# Patient Record
Sex: Male | Born: 1970 | Race: White | Hispanic: No | Marital: Married | Smoking: Never smoker
Health system: Southern US, Community
[De-identification: ages and names within clinical notes are randomized; demographics above are authoritative.]

## PROBLEM LIST (undated history)

## (undated) DIAGNOSIS — N200 Calculus of kidney: Secondary | ICD-10-CM

## (undated) DIAGNOSIS — E785 Hyperlipidemia, unspecified: Secondary | ICD-10-CM

## (undated) DIAGNOSIS — F319 Bipolar disorder, unspecified: Secondary | ICD-10-CM

## (undated) DIAGNOSIS — F32A Depression, unspecified: Secondary | ICD-10-CM

## (undated) DIAGNOSIS — F419 Anxiety disorder, unspecified: Secondary | ICD-10-CM

## (undated) HISTORY — DX: Anxiety disorder, unspecified: F41.9

## (undated) HISTORY — DX: Depression, unspecified: F32.A

## (undated) HISTORY — PX: APPENDECTOMY: SHX54

## (undated) HISTORY — DX: Hyperlipidemia, unspecified: E78.5

## (undated) HISTORY — PX: BACK SURGERY: SHX140

## (undated) HISTORY — PX: TYMPANOPLASTY: SHX33

## (undated) HISTORY — PX: HERNIA REPAIR: SHX51

---

## 2004-07-08 ENCOUNTER — Emergency Department: Payer: Self-pay | Admitting: Unknown Physician Specialty

## 2004-09-10 ENCOUNTER — Observation Stay: Payer: Self-pay | Admitting: General Surgery

## 2004-09-23 ENCOUNTER — Emergency Department: Payer: Self-pay | Admitting: Emergency Medicine

## 2005-06-15 ENCOUNTER — Emergency Department: Payer: Self-pay | Admitting: Emergency Medicine

## 2005-06-23 ENCOUNTER — Emergency Department: Payer: Self-pay | Admitting: Emergency Medicine

## 2006-01-12 ENCOUNTER — Emergency Department: Payer: Self-pay | Admitting: Emergency Medicine

## 2006-01-14 ENCOUNTER — Ambulatory Visit: Payer: Self-pay | Admitting: Specialist

## 2008-09-21 ENCOUNTER — Emergency Department: Payer: Self-pay | Admitting: Emergency Medicine

## 2009-01-25 ENCOUNTER — Ambulatory Visit: Payer: Self-pay | Admitting: Unknown Physician Specialty

## 2009-02-12 ENCOUNTER — Ambulatory Visit: Payer: Self-pay | Admitting: Unknown Physician Specialty

## 2009-04-30 ENCOUNTER — Ambulatory Visit: Payer: Self-pay | Admitting: Unknown Physician Specialty

## 2009-09-23 ENCOUNTER — Emergency Department: Payer: Self-pay | Admitting: Emergency Medicine

## 2009-10-27 ENCOUNTER — Emergency Department: Payer: Self-pay | Admitting: Emergency Medicine

## 2009-12-19 ENCOUNTER — Ambulatory Visit: Payer: Self-pay | Admitting: Unknown Physician Specialty

## 2010-03-24 ENCOUNTER — Emergency Department: Payer: Self-pay | Admitting: Emergency Medicine

## 2010-03-27 ENCOUNTER — Emergency Department: Payer: Self-pay | Admitting: Unknown Physician Specialty

## 2010-07-14 ENCOUNTER — Emergency Department: Payer: Self-pay | Admitting: Internal Medicine

## 2011-08-15 ENCOUNTER — Emergency Department: Payer: Self-pay | Admitting: Emergency Medicine

## 2011-08-15 LAB — CBC
MCHC: 33.8 g/dL (ref 32.0–36.0)
RBC: 4.86 10*6/uL (ref 4.40–5.90)
WBC: 8.2 10*3/uL (ref 3.8–10.6)

## 2011-08-15 LAB — COMPREHENSIVE METABOLIC PANEL
BUN: 8 mg/dL (ref 7–18)
Bilirubin,Total: 0.2 mg/dL (ref 0.2–1.0)
Calcium, Total: 8.2 mg/dL — ABNORMAL LOW (ref 8.5–10.1)
Chloride: 106 mmol/L (ref 98–107)
Co2: 24 mmol/L (ref 21–32)
EGFR (Non-African Amer.): >60
Glucose: 276 mg/dL — ABNORMAL HIGH (ref 65–99)
Osmolality: 288 (ref 275–301)
Potassium: 3.7 mmol/L (ref 3.5–5.1)
SGPT (ALT): 57 U/L
Total Protein: 6.6 g/dL (ref 6.4–8.2)

## 2011-08-15 LAB — URINALYSIS, COMPLETE
Bacteria: NONE SEEN
Bilirubin,UR: NEGATIVE
Ketone: NEGATIVE
Ph: 5 (ref 4.5–8.0)
Specific Gravity: 1.033 (ref 1.003–1.030)
WBC UR: <1 /HPF (ref 0–5)

## 2014-02-27 ENCOUNTER — Emergency Department: Payer: Self-pay | Admitting: Emergency Medicine

## 2014-02-27 LAB — URINALYSIS, COMPLETE
BACTERIA: NONE SEEN
Bilirubin,UR: NEGATIVE
GLUCOSE, UR: NEGATIVE mg/dL (ref 0–75)
Nitrite: NEGATIVE
Ph: 5 (ref 4.5–8.0)
Protein: 30
RBC,UR: 4 /HPF (ref 0–5)
SPECIFIC GRAVITY: 1.024 (ref 1.003–1.030)
Squamous Epithelial: NONE SEEN
WBC UR: 9 /HPF (ref 0–5)

## 2014-02-27 LAB — COMPREHENSIVE METABOLIC PANEL
ANION GAP: 9 (ref 7–16)
Albumin: 4.1 g/dL (ref 3.4–5.0)
Alkaline Phosphatase: 63 U/L
BUN: 16 mg/dL (ref 7–18)
Bilirubin,Total: 0.8 mg/dL (ref 0.2–1.0)
CREATININE: 1.18 mg/dL (ref 0.60–1.30)
Calcium, Total: 9.3 mg/dL (ref 8.5–10.1)
Chloride: 104 mmol/L (ref 98–107)
Co2: 24 mmol/L (ref 21–32)
EGFR (African American): >60
Glucose: 110 mg/dL — ABNORMAL HIGH (ref 65–99)
Osmolality: 276 (ref 275–301)
Potassium: 3.8 mmol/L (ref 3.5–5.1)
SGOT(AST): 17 U/L (ref 15–37)
SGPT (ALT): 28 U/L
Sodium: 137 mmol/L (ref 136–145)
Total Protein: 7.5 g/dL (ref 6.4–8.2)

## 2014-02-27 LAB — CBC
HCT: 49.7 % (ref 40.0–52.0)
HGB: 16.5 g/dL (ref 13.0–18.0)
MCH: 29.6 pg (ref 26.0–34.0)
MCHC: 33.3 g/dL (ref 32.0–36.0)
MCV: 89 fL (ref 80–100)
PLATELETS: 218 10*3/uL (ref 150–440)
RBC: 5.58 10*6/uL (ref 4.40–5.90)
RDW: 13.3 % (ref 11.5–14.5)
WBC: 12.1 10*3/uL — ABNORMAL HIGH (ref 3.8–10.6)

## 2014-02-28 DIAGNOSIS — K5900 Constipation, unspecified: Secondary | ICD-10-CM | POA: Insufficient documentation

## 2014-03-13 ENCOUNTER — Ambulatory Visit: Payer: Self-pay | Admitting: Urology

## 2014-03-22 ENCOUNTER — Ambulatory Visit: Payer: Self-pay | Admitting: Urology

## 2014-04-10 ENCOUNTER — Ambulatory Visit: Payer: Self-pay | Admitting: Urology

## 2014-05-15 ENCOUNTER — Ambulatory Visit: Payer: Self-pay | Admitting: Urology

## 2014-07-07 NOTE — Op Note (Signed)
PATIENT NAME:  Derrick Joyce, [employer] MR#:  [id number] DATE OF BIRTH:  10-04-1970  DATE OF PROCEDURE:  03/13/2014  PREOPERATIVE DIAGNOSIS: Left ureterolithiasis.   POSTOPERATIVE DIAGNOSIS: Left ureterolithiasis.   PROCEDURES: 1.  Left ureteroscopy.  2.  Cystoscopy with left double pigtail stent placement.  3.  Fluoroscopy.   SURGEON: Suszanne ConnersMichael R. Evelene CroonWolff, MD  ANESTHETIST:  Dr. Darleene CleaverVan Staveren.  ANESTHETIC METHOD: General.   INDICATIONS: See the dictated history and physical. After informed consent, the patient requests the above procedure.   OPERATIVE SUMMARY: After adequate general anesthesia had been obtained, the patient was placed into dorsal lithotomy position and the perineum was prepped and draped in the usual fashion. Fluoroscopy revealed that the patient had a 5 mm stone over the left SI joint. At this point, the 21-French cystoscope was coupled with the camera and then visually advanced into the bladder. The bladder was thoroughly inspected. No bladder mucosal lesions were identified. Both ureteral orifices were identified and had clear efflux. At this point, a 0.035 Glidewire was advanced up the left ureter beyond the stone and curled into the left renal pelvis. A 7 mm balloon-dilating catheter was then placed at the level of the intramural ureter, and the ureter was dilated. The balloon was then deflated and the dilating catheter was removed, taking care to leave the guidewire in position. The mini Storz rigid ureteroscope was then advanced alongside the guidewire. The ureteroscope could not be advanced beyond the level of the iliac vessels due to severe angulation. At this point, the ureteroscope was removed, and the ureteral access sheath was passed over the guidewire. The access sheath could not be passed beyond the level of the lower SI joint. The flexible ureteroscope was then visually advanced up the access sheath, but again, could not be advanced beyond the level of the iliac vessels due to  angulation and narrowing of the ureter at this point. At this point, the ureteroscope and access sheath were removed and the cystoscope back loaded over the guidewire. A 6 x 26 cm double pigtail stent was then advanced over the guidewire and positioned in the ureter under fluoroscopic guidance. The guidewire was removed taking care to leave the stent in position. The bladder was drained and the cystoscope was removed, 10 mL of viscous Xylocaine was instilled within the urethra and the bladder. A B and O suppository was placed. The procedure was then terminated, and the patient was transferred to the recovery room in stable condition.    ____________________________ Suszanne ConnersMichael R. Evelene CroonWolff, MD mrw:mw D: 03/13/2014 16:46:46 ET T: 03/13/2014 17:14:46 ET JOB#: [id number]  cc: Suszanne ConnersMichael R. Evelene CroonWolff, MD, <Dictator> Orson ApeMICHAEL R WOLFF MD ELECTRONICALLY SIGNED 03/13/2014 18:20

## 2014-07-15 NOTE — H&P (Signed)
PATIENT NAME:  Derrick Joyce, Derrick Joyce MR#:  [id number] DATE OF BIRTH:  Oct 04, 1970  DATE OF ADMISSION:  04/10/2014  CHIEF COMPLAINT: Ureteral calculus.   HISTORY OF PRESENT ILLNESS: Mr. Derrick Joyce is a 44 year old Caucasian male who underwent left stent placement 12 /28/2015, followed by lithotripsy January 7th. He has passed most of his stone burden and comes in now for cystoscopy with stent retrieval.   PAST MEDICAL HISTORY:  ALLERGIES: No drug allergies.   CURRENT MEDICATIONS: Percocet and tamsulosin.   PAST SURGICAL HISTORY: Includes appendectomy in 2007 and repair of injured left finger in 2008, and bilateral ear surgery in 2012.   SOCIAL HISTORY: The patient denied tobacco use. He consumes 1-4 alcoholic beverages per week.   FAMILY HISTORY: Father and grandfather have kidney stones both parents have diabetes and hypertension.   PAST AND CURRENT MEDICAL CONDITIONS: Kidney stones.   REVIEW OF SYSTEMS: The patient denied chest pain, shortness of breath, diabetes, stroke, heart disease, or hypertension.   PHYSICAL EXAMINATION: GENERAL: A well-nourished white male in no acute distress.  HEENT: Sclerae were clear. Pupils are equally round, reactive to light and accommodation. Extraocular movements are intact.  NECK: Supple. No palpable cervical lymphadenopathy. No audible carotid bruits.  LUNGS: Clear to auscultation.  CARDIOVASCULAR: Regular rhythm and rate without audible murmurs.  ABDOMEN: Soft, nontender abdomen.  GENITOURINARY:  Rectal exam deferred.  NEUROLOGICAL: Alert, oriented x 3.   IMPRESSION: Left ureterolithiasis status post stent placement and lithotripsy.    ____________________________ Suszanne ConnersMichael R. Evelene CroonWolff, MD mrw:mw D: 04/04/2014 11:48:00 ET T: 04/04/2014 12:20:33 ET JOB#: [id number]  cc: Suszanne ConnersMichael R. Evelene CroonWolff, MD, <Dictator> Orson ApeMICHAEL R WOLFF MD ELECTRONICALLY SIGNED 04/05/2014 10:16

## 2014-07-15 NOTE — Op Note (Signed)
PATIENT NAME:  Derrick Joyce, Derrick Joyce MR#:  [id number] DATE OF BIRTH:  1970-09-09  DATE OF PROCEDURE:  05/15/2014  PREOPERATIVE DIAGNOSIS: Left inguinal hernia.   POSTOPERATIVE DIAGNOSIS: Right inguinal hernia.   PROCEDURES:  1.  Left inguinal herniorrhaphy.  2.  Spermatic cord block.   SURGEON: Anola GurneyMichael Rielly Corlett, MD   ANESTHETIST: Beryle Lathearol Anne Chrystle Murillo.   ANESTHETIC METHOD: General per Okey Regalarol and spermatic cord block per Dr. Evelene CroonWolff.   INDICATIONS: See the dictated history and physical. After informed consent, the patient requests the above procedure.   OPERATIVE SUMMARY: After adequate general anesthesia had been obtained, the patient was placed into dorsal lithotomy position and the perineum was prepped and draped in the usual fashion. A left inguinal crease incision was made and carried down sharply through the skin and through the subcutaneous fascia and fat with cautery. The external oblique fascia and spermatic cord were cleared of overlying fatty tissue. Spermatic cord was delivered into the surgical field. Cremasteric fibers were divided and the hernia sac was exposed. Hernia sac was dissected proximally to the level of the internal ring and then ligated with a 2-0 Surgilon suture. The hernia sac was discarded. At this point, the retropubic space was developed using finger dissection. The retropubic space was irrigated with GU irrigant. The Ethicon PHS graft was then selected, and the circular portion unfurled into the retropubic space. The oblong portion of the graft was then placed beneath the external oblique fascia, and a keyhole incision was made laterally to accommodate the cord. The edges of the keyhole slit were then brought together around the cord and anchored to the inguinal ligament with 2-0 Surgilon suture. The distal edge of the oblong section of the graft was anchored to the pubic tubercle with a 2-0 Surgilon suture. The oblong graft was then flattened out beneath the external oblique fascia.  Spermatic cord was then placed into its normal anatomic position. Spermatic cord block was performed with 5 mL of 1% Xylocaine mixed with 0.5% Marcaine. Surgical field was copiously irrigated with GU irrigant. External oblique fascia was then reapproximated with interrupted 2-0 Surgilon suture. Subcutaneous fatty tissue was reapproximated with interrupted 2-0 chromic suture and skin was closed with 4-0 Vicryl subcuticular closure, followed by Dermabond, Benzoin, Steri-Strips and sterile dressing. A scrotal supporter and fluffs were applied. Sponge, needle, and instrument counts were noted be correct. The procedure was then terminated and patient was transferred to the recovery room in stable condition.    ____________________________ Suszanne ConnersMichael R. Evelene CroonWolff, MD mrw:TM D: 05/15/2014 18:17:30 ET T: 05/16/2014 00:38:25 ET JOB#: [id number]  cc: Suszanne ConnersMichael R. Evelene CroonWolff, MD, <Dictator> Orson ApeMICHAEL R Eugenia Eldredge MD ELECTRONICALLY SIGNED 05/17/2014 9:51

## 2014-07-15 NOTE — H&P (Signed)
PATIENT NAME:  Derrick Joyce, Derrick Joyce MR#:  [id number] DATE OF BIRTH:  August 27, 1970  DATE OF ADMISSION:  05/15/2014  CHIEF COMPLAINT: Left groin pain.   HISTORY OF PRESENT ILLNESS: Derrick Joyce is a 44 year old, Caucasian male, with a 1-year history of intermittent left groin pain radiating to his left testicle. On physical examination, he was found to have an easily reducible left inguinal hernia, which was confirmed by CT scan. He comes in now for a left inguinal herniorrhaphy.   ALLERGIES: No drug allergies.   MEDICATIONS: No medications.   PAST SURGICAL HISTORY: Appendectomy in 2007, repair of injured left finger in 2008, bilateral ear surgery in 2012, ureteroscopic ureterolithotomy in 2015.   SOCIAL HISTORY: The patient denied tobacco use. Consumes 1 to 4 alcoholic beverages per week.   FAMILY HISTORY: Father and grandfather have kidney stones. Both parents have diabetes and hypertension.   PAST AND CURRENT MEDICAL CONDITIONS: Kidney stones.   REVIEW OF SYSTEMS: The patient denied chest pain, shortness of breath, diabetes, stroke or hypertension.   PHYSICAL EXAMINATION:  GENERAL: A well-nourished, white male, in no acute distress.  HEENT: Sclerae were clear. Pupils were equally round, reactive to light and accommodation. Extraocular movements were intact.  NECK: Supple. No palpable cervical adenopathy.  LUNGS: Clear to auscultation.  CARDIOVASCULAR: Regular rhythm and rate without audible murmurs.  ABDOMEN: Soft, nontender abdomen.  GENITOURINARY: Circumcised. Easily reducible left inguinal hernia.  RECTAL: Deferred.  NEUROMUSCULAR: Grossly intact.   IMPRESSION: Left inguinal hernia.   PLAN: Left inguinal herniorrhaphy.   ____________________________ Suszanne ConnersMichael R. Evelene CroonWolff, MD mrw:JT D: 05/08/2014 11:55:04 ET T: 05/08/2014 12:08:43 ET JOB#: [id number]  cc: Suszanne ConnersMichael R. Evelene CroonWolff, MD, <Dictator> Orson ApeMICHAEL R Montine Hight MD ELECTRONICALLY SIGNED 05/08/2014 15:45

## 2014-07-15 NOTE — Op Note (Signed)
PATIENT NAME:  Derrick Joyce, Derrick Joyce MR#:  [id number] DATE OF BIRTH:  07/13/1970  DATE OF PROCEDURE:  04/10/2014  PREOPERATIVE DIAGNOSIS: Left ureterolithiasis.   POSTOPERATIVE DIAGNOSIS: Left ureterolithiasis.   PROCEDURES: 1. Cystoscopy with stent retrieval.  2. Ureteroscopy.  3. Fluoroscopy.   SURGEON: Suszanne ConnersMichael R. Evelene CroonWolff, MD.    ANESTHETISBerdine Addison:  Mathai Thomas, MD   ANESTHETIC METHOD: General.   INDICATIONS: See the dictated history and physical.   After informed consent, the patient requests the above procedure.   OPERATIVE SUMMARY: After adequate general anesthesia had been obtained, the patient was placed into dorsal lithotomy position and the perineum was prepped and draped in the usual fashion. Fluoroscopy was performed, indicating left double pigtail stent is in good position. The patient appeared to possibly have some small stone fragments in the distal left ureter. At this point, the 4821 French cystoscope was coupled with the camera and then visually advanced into the bladder. The bladder was thoroughly inspected. No bladder tumors were identified. The stent was identified and engaged with the alligator forceps. Stent was then retrieved. At this point, the cystoscope was reintroduced into the bladder a 0.035 Glidewire advanced up the left ureter. The mini Storz ureteroscope was then advanced up the left ureter to the area where the stone fragments were identified. There were 2 or 3 at less than 1 mm fragments in this location. No other abnormalities were identified. At this point, the ureteroscope and guidewire were removed. Xylocaine Viscous 10 mL was instilled within the urethra and the bladder. A B and O suppository was placed. The procedure was then terminated, and the patient was transferred to the recovery room in stable condition.     ____________________________ Suszanne ConnersMichael R. Evelene CroonWolff, MD mrw:mw D: 04/10/2014 17:03:54 ET T: 04/10/2014 18:57:18 ET JOB#: [id number]  cc: Suszanne ConnersMichael R. Evelene CroonWolff,  MD, <Dictator> Orson ApeMICHAEL R WOLFF MD ELECTRONICALLY SIGNED 04/11/2014 9:50

## 2014-08-06 ENCOUNTER — Encounter: Payer: Self-pay | Admitting: Emergency Medicine

## 2014-08-06 ENCOUNTER — Emergency Department
Admission: EM | Admit: 2014-08-06 | Discharge: 2014-08-06 | Disposition: A | Discharge status: Home / Self Care | Payer: BLUE CROSS/BLUE SHIELD | Attending: Emergency Medicine | Admitting: Emergency Medicine

## 2014-08-06 DIAGNOSIS — M5441 Lumbago with sciatica, right side: Secondary | ICD-10-CM | POA: Diagnosis not present

## 2014-08-06 DIAGNOSIS — R109 Unspecified abdominal pain: Secondary | ICD-10-CM | POA: Diagnosis present

## 2014-08-06 HISTORY — DX: Calculus of kidney: N20.0

## 2014-08-06 LAB — URINALYSIS COMPLETE WITH MICROSCOPIC (ARMC ONLY)
Bacteria, UA: NONE SEEN
Bilirubin Urine: NEGATIVE
Glucose, UA: NEGATIVE mg/dL
Hgb urine dipstick: NEGATIVE
KETONES UR: NEGATIVE mg/dL
Leukocytes, UA: NEGATIVE
Nitrite: NEGATIVE
PH: 6 (ref 5.0–8.0)
Protein, ur: NEGATIVE mg/dL
SPECIFIC GRAVITY, URINE: 1.018 (ref 1.005–1.030)
Squamous Epithelial / LPF: NONE SEEN

## 2014-08-06 LAB — COMPREHENSIVE METABOLIC PANEL
ALBUMIN: 4.6 g/dL (ref 3.5–5.0)
ALT: 17 U/L (ref 17–63)
ANION GAP: 9 (ref 5–15)
AST: 18 U/L (ref 15–41)
Alkaline Phosphatase: 56 U/L (ref 38–126)
BILIRUBIN TOTAL: 0.5 mg/dL (ref 0.3–1.2)
BUN: 16 mg/dL (ref 6–20)
CALCIUM: 9.5 mg/dL (ref 8.9–10.3)
CO2: 28 mmol/L (ref 22–32)
Chloride: 105 mmol/L (ref 101–111)
Creatinine, Ser: 0.84 mg/dL (ref 0.61–1.24)
GFR calc Af Amer: >60 mL/min (ref >60)
GFR calc non Af Amer: >60 mL/min (ref >60)
Glucose, Bld: 122 mg/dL — ABNORMAL HIGH (ref 65–99)
Potassium: 4.5 mmol/L (ref 3.5–5.1)
SODIUM: 142 mmol/L (ref 135–145)
TOTAL PROTEIN: 7.2 g/dL (ref 6.5–8.1)

## 2014-08-06 LAB — CBC
HEMATOCRIT: 47.7 % (ref 40.0–52.0)
Hemoglobin: 15.6 g/dL (ref 13.0–18.0)
MCH: 29.5 pg (ref 26.0–34.0)
MCHC: 32.8 g/dL (ref 32.0–36.0)
MCV: 89.8 fL (ref 80.0–100.0)
PLATELETS: 181 10*3/uL (ref 150–440)
RBC: 5.31 MIL/uL (ref 4.40–5.90)
RDW: 13.9 % (ref 11.5–14.5)
WBC: 7.1 10*3/uL (ref 3.8–10.6)

## 2014-08-06 LAB — LIPASE, BLOOD: LIPASE: 34 U/L (ref 22–51)

## 2014-08-06 MED ORDER — ETODOLAC 500 MG PO TABS: 500.0000 mg | ORAL_TABLET | Freq: Two times a day (BID) | ORAL | Status: DC

## 2014-08-06 MED ORDER — CYCLOBENZAPRINE HCL 10 MG PO TABS: 10.0000 mg | ORAL_TABLET | Freq: Three times a day (TID) | ORAL | Status: DC | PRN

## 2014-08-06 MED ORDER — KETOROLAC TROMETHAMINE 60 MG/2ML IM SOLN
INTRAMUSCULAR | Status: AC
  Administered 2014-08-06: 60 mg
  Filled 2014-08-06: qty 2

## 2014-08-06 MED ORDER — KETOROLAC TROMETHAMINE 30 MG/ML IJ SOLN: 60.0000 mg | Freq: Once | INTRAMUSCULAR | Status: AC

## 2014-08-06 NOTE — ED Notes (Signed)
hisotry of R flank pain x 2 weeks, history of kidney stones on L

## 2014-08-06 NOTE — Discharge Instructions (Signed)
Sciatica Sciatica is pain, weakness, numbness, or tingling along the path of the sciatic nerve. The nerve starts in the lower back and runs down the back of each leg. The nerve controls the muscles in the lower leg and in the back of the knee, while also providing sensation to the back of the thigh, lower leg, and the sole of your foot. Sciatica is a symptom of another medical condition. For instance, nerve damage or certain conditions, such as a herniated disk or bone spur on the spine, pinch or put pressure on the sciatic nerve. This causes the pain, weakness, or other sensations normally associated with sciatica. Generally, sciatica only affects one side of the body. CAUSES   Herniated or slipped disc.  Degenerative disk disease.  A pain disorder involving the narrow muscle in the buttocks (piriformis syndrome).  Pelvic injury or fracture.  Pregnancy.  Tumor (rare). SYMPTOMS  Symptoms can vary from mild to very severe. The symptoms usually travel from the low back to the buttocks and down the back of the leg. Symptoms can include:  Mild tingling or dull aches in the lower back, leg, or hip.  Numbness in the back of the calf or sole of the foot.  Burning sensations in the lower back, leg, or hip.  Sharp pains in the lower back, leg, or hip.  Leg weakness.  Severe back pain inhibiting movement. These symptoms may get worse with coughing, sneezing, laughing, or prolonged sitting or standing. Also, being overweight may worsen symptoms. DIAGNOSIS  Your caregiver will perform a physical exam to look for common symptoms of sciatica. He or she may ask you to do certain movements or activities that would trigger sciatic nerve pain. Other tests may be performed to find the cause of the sciatica. These may include:  Blood tests.  X-rays.  Imaging tests, such as an MRI or CT scan. TREATMENT  Treatment is directed at the cause of the sciatic pain. Sometimes, treatment is not necessary  and the pain and discomfort goes away on its own. If treatment is needed, your caregiver may suggest:  Over-the-counter medicines to relieve pain.  Prescription medicines, such as anti-inflammatory medicine, muscle relaxants, or narcotics.  Applying heat or ice to the painful area.  Steroid injections to lessen pain, irritation, and inflammation around the nerve.  Reducing activity during periods of pain.  Exercising and stretching to strengthen your abdomen and improve flexibility of your spine. Your caregiver may suggest losing weight if the extra weight makes the back pain worse.  Physical therapy.  Surgery to eliminate what is pressing or pinching the nerve, such as a bone spur or part of a herniated disk. HOME CARE INSTRUCTIONS   Only take over-the-counter or prescription medicines for pain or discomfort as directed by your caregiver.  Apply ice to the affected area for 20 minutes, 3-4 times a day for the first 48-72 hours. Then try heat in the same way.  Exercise, stretch, or perform your usual activities if these do not aggravate your pain.  Attend physical therapy sessions as directed by your caregiver.  Keep all follow-up appointments as directed by your caregiver.  Do not wear high heels or shoes that do not provide proper support.  Check your mattress to see if it is too soft. A firm mattress may lessen your pain and discomfort. SEEK IMMEDIATE MEDICAL CARE IF:   You lose control of your bowel or bladder (incontinence).  You have increasing weakness in the lower back, pelvis, buttocks,   or legs.  You have redness or swelling of your back.  You have a burning sensation when you urinate.  You have pain that gets worse when you lie down or awakens you at night.  Your pain is worse than you have experienced in the past.  Your pain is lasting longer than 4 weeks.  You are suddenly losing weight without reason. MAKE SURE YOU:  Understand these  instructions.  Will watch your condition.  Will get help right away if you are not doing well or get worse. Document Released: 02/24/2001 Document Revised: 09/01/2011 Document Reviewed: 07/12/2011 [employer] Patient Information 2015 [employer]. This information is not intended to replace advice given to you by your health care provider. Make sure you discuss any questions you have with your health care provider.  

## 2014-08-06 NOTE — ED Provider Notes (Signed)
[facility] Emergency Department Provider Note  ____________________________________________  Time seen: Approximately 12:26 PM  I have reviewed the triage vital signs and the nursing notes.   HISTORY  Chief Complaint Flank Pain    HPI Derrick Joyce is a 44 y.o. male presents with complaints of low back pain radiating down the right leg 2 weeks. States that he was seen with chloroquine. No relief with medication given naproxen and hydrocodone. He is to have an MRI scheduled but has not heard back about the date yet.. Rates pain as 10 over 10 worsened with movement and position relief with right position but only temporarily.   Past Medical History  Diagnosis Date  . Kidney stones     There are no active problems to display for this patient.   No past surgical history on file.  Current Outpatient Rx  Name  Route  Sig  Dispense  Refill  . cyclobenzaprine (FLEXERIL) 10 MG tablet   Oral   Take 1 tablet (10 mg total) by mouth every 8 (eight) hours as needed for muscle spasms.   30 tablet   1   . etodolac (LODINE) 500 MG tablet   Oral   Take 1 tablet (500 mg total) by mouth 2 (two) times daily.   45 tablet   0     Allergies Review of patient's allergies indicates no known allergies.  No family history on file.  Social History History  Substance Use Topics  . Smoking status: Never Smoker   . Smokeless tobacco: Not on file  . Alcohol Use: No    Review of Systems Constitutional: No fever/chills Eyes: No visual changes. ENT: No sore throat. Cardiovascular: Denies chest pain. Respiratory: Denies shortness of breath. Gastrointestinal: No abdominal pain.  No nausea, no vomiting.  No diarrhea.  No constipation. Genitourinary: Negative for dysuria. Musculoskeletal: Positive for severe low back pain with radiation to right leg. Skin: Negative for rash. Neurological: Negative for headaches, focal weakness or numbness.  10-point ROS  otherwise negative.  ____________________________________________   PHYSICAL EXAM:  VITAL SIGNS: ED Triage Vitals  Enc Vitals Group     BP 08/06/14 0955 145/69 mmHg     Pulse Rate 08/06/14 0955 62     Resp 08/06/14 0955 20     Temp 08/06/14 0955 98.3 F (36.8 C)     Temp Source 08/06/14 0955 Oral     SpO2 08/06/14 0955 100 %     Weight 08/06/14 0955 181 lb (82.101 kg)     Height 08/06/14 0955  (1.778 m)     Head Cir --      Peak Flow --      Pain Score 08/06/14 0956 6     Pain Loc --      Pain Edu? --      Excl. in GC? --     Constitutional: Alert and oriented. Well appearing and in no acute distress. Neck: No stridor.   Cardiovascular: Normal rate, regular rhythm. Grossly normal heart sounds.  Good peripheral circulation. Respiratory: Normal respiratory effort.  No retractions. Lungs CTAB. Gastrointestinal: Soft and nontender. No distention. No abdominal bruits. No CVA tenderness. Musculoskeletal: No lower extremity tenderness nor edema.  No joint effusions. Straight leg raise positive on the right at 20 and negative on the left. Neurovascularly intact Neurologic:  Normal speech and language. No gross focal neurologic deficits are appreciated. Speech is normal. Ambulates slowly Skin:  Skin is warm, dry and intact. No rash noted.  Psychiatric: Mood and affect are normal. Speech and behavior are normal.  ____________________________________________   LABS (all labs ordered are listed, but only abnormal results are displayed)  Labs Reviewed  COMPREHENSIVE METABOLIC PANEL - Abnormal; Notable for the following:    Glucose, Bld 122 (*)    All other components within normal limits  URINALYSIS COMPLETEWITH MICROSCOPIC ([facility])  - Abnormal; Notable for the following:    Color, Urine YELLOW (*)    APPearance CLEAR (*)    All other components within normal limits  CBC  LIPASE, BLOOD   ____________________________________________  EKG  Not  applicable ____________________________________________  RADIOLOGY  Not applicable ____________________________________________   PROCEDURES  Procedure(s) performed: None  Critical Care performed: No  ____________________________________________   INITIAL IMPRESSION / ASSESSMENT AND PLAN / ED COURSE  Pertinent labs & imaging results that were available during my care of the patient were reviewed by me and considered in my medical decision making (see chart for details).  Acute sciatica. [facility]. Toradol 60 mg IM given with minimal relief. Rx for Flexeril 10 mg 3 times a day, etodolac 500 mg twice a day, and to return if symptoms worsen. ____________________________________________   FINAL CLINICAL IMPRESSION(S) / ED DIAGNOSES  Final diagnoses:  Acute back pain with sciatica, right      Evangeline DakinCharles M Leisa Gault, PA-C 08/06/14 1532  Jene Everyobert Kinner, MD 08/08/14 1257

## 2014-08-08 ENCOUNTER — Other Ambulatory Visit: Payer: Self-pay | Admitting: Unknown Physician Specialty

## 2014-08-08 DIAGNOSIS — M5440 Lumbago with sciatica, unspecified side: Secondary | ICD-10-CM

## 2014-08-09 ENCOUNTER — Ambulatory Visit
Admission: RE | Admit: 2014-08-09 | Discharge: 2014-08-09 | Disposition: A | Discharge status: Home / Self Care | Payer: BLUE CROSS/BLUE SHIELD | Source: Ambulatory Visit | Attending: Unknown Physician Specialty | Admitting: Unknown Physician Specialty

## 2014-08-09 DIAGNOSIS — M4807 Spinal stenosis, lumbosacral region: Secondary | ICD-10-CM | POA: Insufficient documentation

## 2014-08-09 DIAGNOSIS — M5440 Lumbago with sciatica, unspecified side: Secondary | ICD-10-CM

## 2014-08-09 DIAGNOSIS — M2578 Osteophyte, vertebrae: Secondary | ICD-10-CM | POA: Insufficient documentation

## 2014-08-09 DIAGNOSIS — M5126 Other intervertebral disc displacement, lumbar region: Secondary | ICD-10-CM | POA: Insufficient documentation

## 2014-08-09 MED ORDER — GADOBENATE DIMEGLUMINE 529 MG/ML IV SOLN
17.0000 mL | Freq: Once | INTRAVENOUS | Status: AC
  Administered 2014-08-09: 20 mL via INTRAVENOUS

## 2014-08-18 ENCOUNTER — Emergency Department
Admission: EM | Admit: 2014-08-18 | Discharge: 2014-08-18 | Disposition: A | Discharge status: Home / Self Care | Payer: BLUE CROSS/BLUE SHIELD | Attending: Emergency Medicine | Admitting: Emergency Medicine

## 2014-08-18 DIAGNOSIS — M549 Dorsalgia, unspecified: Secondary | ICD-10-CM | POA: Diagnosis present

## 2014-08-18 DIAGNOSIS — Z79899 Other long term (current) drug therapy: Secondary | ICD-10-CM | POA: Insufficient documentation

## 2014-08-18 DIAGNOSIS — M5431 Sciatica, right side: Secondary | ICD-10-CM | POA: Insufficient documentation

## 2014-08-18 MED ORDER — MORPHINE SULFATE 4 MG/ML IJ SOLN
4.0000 mg | Freq: Once | INTRAMUSCULAR | Status: AC
Start: 2014-08-18 — End: 2014-08-18
  Administered 2014-08-18: 4 mg via INTRAVENOUS

## 2014-08-18 MED ORDER — ONDANSETRON HCL 4 MG/2ML IJ SOLN
4.0000 mg | Freq: Once | INTRAMUSCULAR | Status: AC
  Administered 2014-08-18: 4 mg via INTRAVENOUS

## 2014-08-18 MED ORDER — HYDROMORPHONE HCL 1 MG/ML IJ SOLN
1.0000 mg | Freq: Once | INTRAMUSCULAR | Status: AC
  Administered 2014-08-18: 1 mg via INTRAVENOUS

## 2014-08-18 MED ORDER — OXYCODONE-ACETAMINOPHEN 5-325 MG PO TABS: 2.0000 | ORAL_TABLET | Freq: Four times a day (QID) | ORAL | Status: DC | PRN

## 2014-08-18 MED ORDER — HYDROMORPHONE HCL 1 MG/ML IJ SOLN
INTRAMUSCULAR | Status: AC
  Administered 2014-08-18: 1 mg via INTRAVENOUS
  Filled 2014-08-18: qty 1

## 2014-08-18 MED ORDER — ONDANSETRON HCL 4 MG/2ML IJ SOLN
INTRAMUSCULAR | Status: AC
  Administered 2014-08-18: 4 mg via INTRAVENOUS
  Filled 2014-08-18: qty 2

## 2014-08-18 MED ORDER — MORPHINE SULFATE 4 MG/ML IJ SOLN
INTRAMUSCULAR | Status: AC
  Administered 2014-08-18: 4 mg via INTRAVENOUS
  Filled 2014-08-18: qty 1

## 2014-08-18 NOTE — ED Notes (Signed)
Pt in with co back pain x 3 weeks radiates into right leg, hx of pinched nerve.

## 2014-08-18 NOTE — ED Provider Notes (Signed)
[facility] Emergency Department Provider Note  ____________________________________________  Time seen: 5:00 AM  I have reviewed the triage vital signs and the nursing notes.   HISTORY  Chief Complaint Back Pain      HPI Derrick Joyce is a 44 y.o. male Modena Jansky with right back pain radiating down posterior right leg 3 weeks. Patient admits to having an MRI performed at [facility] regional last week which revealed a herniated disc in his lumbar spine. Patient has an appointment with neurosurgery this coming Tuesday patient states pain not controlled at home with hydrocodone and Lodine.     Past Medical History  Diagnosis Date  . Kidney stones     There are no active problems to display for this patient.   No past surgical history on file.  Current Outpatient Rx  Name  Route  Sig  Dispense  Refill  . cyclobenzaprine (FLEXERIL) 10 MG tablet   Oral   Take 1 tablet (10 mg total) by mouth every 8 (eight) hours as needed for muscle spasms.   30 tablet   1   . etodolac (LODINE) 500 MG tablet   Oral   Take 1 tablet (500 mg total) by mouth 2 (two) times daily.   45 tablet   0     Allergies Review of patient's allergies indicates no known allergies.  No family history on file.  Social History History  Substance Use Topics  . Smoking status: Never Smoker   . Smokeless tobacco: Not on file  . Alcohol Use: No    Review of Systems  Constitutional: Negative for fever. Eyes: Negative for visual changes. ENT: Negative for sore throat. Cardiovascular: Negative for chest pain. Respiratory: Negative for shortness of breath. Gastrointestinal: Negative for abdominal pain, vomiting and diarrhea. Positive right back pain Genitourinary: Negative for dysuria. Musculoskeletal: Negative for back pain. Skin: Negative for rash. Neurological: Negative for headaches, focal weakness or numbness.   10-point ROS otherwise  negative.  ____________________________________________   PHYSICAL EXAM:  VITAL SIGNS: ED Triage Vitals  Enc Vitals Group     BP 08/18/14 0118 141/99 mmHg     Pulse Rate 08/18/14 0118 111     Resp 08/18/14 0118 18     Temp 08/18/14 0118 98.4 F (36.9 C)     Temp Source 08/18/14 0118 Oral     SpO2 08/18/14 0118 100 %     Weight 08/18/14 0118 181 lb (82.101 kg)     Height 08/18/14 0118  (1.778 m)     Head Cir --      Peak Flow --      Pain Score 08/18/14 0119 6     Pain Loc --      Pain Edu? --      Excl. in GC? --      Constitutional: Alert and oriented. Apparent distress Eyes: Conjunctivae are normal. PERRL. Normal extraocular movements. ENT   Head: Normocephalic and atraumatic.   Nose: No congestion/rhinnorhea.   Mouth/Throat: Mucous membranes are moist.   Neck: No stridor. Hematological/Lymphatic/Immunilogical: No cervical lymphadenopathy. Cardiovascular: Normal rate, regular rhythm. Normal and symmetric distal pulses are present in all extremities. No murmurs, rubs, or gallops. Respiratory: Normal respiratory effort without tachypnea nor retractions. Breath sounds are clear and equal bilaterally. No wheezes/rales/rhonchi. Gastrointestinal: Soft and nontender. No distention. There is no CVA tenderness. Genitourinary: deferred Musculoskeletal: Nontender with normal range of motion in all extremities. No joint effusions.  No lower extremity tenderness nor edema. Neurologic:  Normal  speech and language. No gross focal neurologic deficits are appreciated. Speech is normal.  Skin:  Skin is warm, dry and intact. No rash noted. Psychiatric: Mood and affect are normal. Speech and behavior are normal. Patient exhibits appropriate insight and judgment.  ____________________________________________     INITIAL IMPRESSION / ASSESSMENT AND PLAN / ED COURSE  Pertinent labs & imaging results that were available during my care of the patient were reviewed by me  and considered in my medical decision making (see chart for details).  History of physical exam consistent with sciatica. Patient received IV morphine 4 mg in the emergency department for analgesia.  ____________________________________________   FINAL CLINICAL IMPRESSION(S) / ED DIAGNOSES  Final diagnoses:  Sciatica, right      Darci Currentandolph N Brown, MD 08/18/14 [phone]

## 2014-08-18 NOTE — Discharge Instructions (Signed)
Sciatica Sciatica is pain, weakness, numbness, or tingling along the path of the sciatic nerve. The nerve starts in the lower back and runs down the back of each leg. The nerve controls the muscles in the lower leg and in the back of the knee, while also providing sensation to the back of the thigh, lower leg, and the sole of your foot. Sciatica is a symptom of another medical condition. For instance, nerve damage or certain conditions, such as a herniated disk or bone spur on the spine, pinch or put pressure on the sciatic nerve. This causes the pain, weakness, or other sensations normally associated with sciatica. Generally, sciatica only affects one side of the body. CAUSES   Herniated or slipped disc.  Degenerative disk disease.  A pain disorder involving the narrow muscle in the buttocks (piriformis syndrome).  Pelvic injury or fracture.  Pregnancy.  Tumor (rare). SYMPTOMS  Symptoms can vary from mild to very severe. The symptoms usually travel from the low back to the buttocks and down the back of the leg. Symptoms can include:  Mild tingling or dull aches in the lower back, leg, or hip.  Numbness in the back of the calf or sole of the foot.  Burning sensations in the lower back, leg, or hip.  Sharp pains in the lower back, leg, or hip.  Leg weakness.  Severe back pain inhibiting movement. These symptoms may get worse with coughing, sneezing, laughing, or prolonged sitting or standing. Also, being overweight may worsen symptoms. DIAGNOSIS  Your caregiver will perform a physical exam to look for common symptoms of sciatica. He or she may ask you to do certain movements or activities that would trigger sciatic nerve pain. Other tests may be performed to find the cause of the sciatica. These may include:  Blood tests.  X-rays.  Imaging tests, such as an MRI or CT scan. TREATMENT  Treatment is directed at the cause of the sciatic pain. Sometimes, treatment is not necessary  and the pain and discomfort goes away on its own. If treatment is needed, your caregiver may suggest:  Over-the-counter medicines to relieve pain.  Prescription medicines, such as anti-inflammatory medicine, muscle relaxants, or narcotics.  Applying heat or ice to the painful area.  Steroid injections to lessen pain, irritation, and inflammation around the nerve.  Reducing activity during periods of pain.  Exercising and stretching to strengthen your abdomen and improve flexibility of your spine. Your caregiver may suggest losing weight if the extra weight makes the back pain worse.  Physical therapy.  Surgery to eliminate what is pressing or pinching the nerve, such as a bone spur or part of a herniated disk. HOME CARE INSTRUCTIONS   Only take over-the-counter or prescription medicines for pain or discomfort as directed by your caregiver.  Apply ice to the affected area for 20 minutes, 3-4 times a day for the first 48-72 hours. Then try heat in the same way.  Exercise, stretch, or perform your usual activities if these do not aggravate your pain.  Attend physical therapy sessions as directed by your caregiver.  Keep all follow-up appointments as directed by your caregiver.  Do not wear high heels or shoes that do not provide proper support.  Check your mattress to see if it is too soft. A firm mattress may lessen your pain and discomfort. SEEK IMMEDIATE MEDICAL CARE IF:   You lose control of your bowel or bladder (incontinence).  You have increasing weakness in the lower back, pelvis, buttocks,   or legs.  You have redness or swelling of your back.  You have a burning sensation when you urinate.  You have pain that gets worse when you lie down or awakens you at night.  Your pain is worse than you have experienced in the past.  Your pain is lasting longer than 4 weeks.  You are suddenly losing weight without reason. MAKE SURE YOU:  Understand these  instructions.  Will watch your condition.  Will get help right away if you are not doing well or get worse. Document Released: 02/24/2001 Document Revised: 09/01/2011 Document Reviewed: 07/12/2011 [employer] Patient Information 2015 [employer]. This information is not intended to replace advice given to you by your health care provider. Make sure you discuss any questions you have with your health care provider.  

## 2015-06-20 ENCOUNTER — Other Ambulatory Visit: Payer: Self-pay | Admitting: Neurosurgery

## 2015-07-26 NOTE — Pre-Procedure Instructions (Signed)
    Ihor Dowrnest Gulley III  07/26/2015      [facility] [place], [place] [address] [place] [id number] Phone: [phone] Fax: [phone]    Your procedure is scheduled on 08/05/15.  [facility] at 530 A.M.  Call this number if you have problems the morning of surgery:  [phone]   Remember:  Do not eat food or drink liquids after midnight.  Take these medicines the morning of surgery with A SIP OF WATER --percocet,neurontin   Do not wear jewelry, make-up or nail polish.  Do not wear lotions, powders, or perfumes.  You may wear deodorant.  Do not shave 48 hours prior to surgery.  Men may shave face and neck.  Do not bring valuables to the hospital.  [facility] is not responsible for any belongings or valuables.  Contacts, dentures or bridgework may not be worn into surgery.  Leave your suitcase in the car.  After surgery it may be brought to your room.  For patients admitted to the hospital, discharge time will be determined by your treatment team.  Patients discharged the day of surgery will not be allowed to driv  Please read over the following fact sheets that you were given. MRSA Information

## 2015-07-29 ENCOUNTER — Encounter (HOSPITAL_COMMUNITY): Payer: Self-pay

## 2015-07-29 ENCOUNTER — Encounter (HOSPITAL_COMMUNITY)
Admission: RE | Admit: 2015-07-29 | Discharge: 2015-07-29 | Disposition: A | Discharge status: Home / Self Care | Payer: 59 | Source: Ambulatory Visit | Attending: Neurosurgery | Admitting: Neurosurgery

## 2015-07-29 DIAGNOSIS — M5136 Other intervertebral disc degeneration, lumbar region: Secondary | ICD-10-CM | POA: Insufficient documentation

## 2015-07-29 DIAGNOSIS — Z0183 Encounter for blood typing: Secondary | ICD-10-CM | POA: Diagnosis not present

## 2015-07-29 DIAGNOSIS — Z01818 Encounter for other preprocedural examination: Secondary | ICD-10-CM | POA: Diagnosis present

## 2015-07-29 DIAGNOSIS — Z01812 Encounter for preprocedural laboratory examination: Secondary | ICD-10-CM | POA: Diagnosis not present

## 2015-07-29 LAB — BASIC METABOLIC PANEL
Anion gap: 9 (ref 5–15)
BUN: 9 mg/dL (ref 6–20)
CO2: 27 mmol/L (ref 22–32)
CREATININE: 0.89 mg/dL (ref 0.61–1.24)
Calcium: 9.6 mg/dL (ref 8.9–10.3)
Chloride: 104 mmol/L (ref 101–111)
GFR calc Af Amer: >60 mL/min (ref >60)
GFR calc non Af Amer: >60 mL/min (ref >60)
Glucose, Bld: 130 mg/dL — ABNORMAL HIGH (ref 65–99)
Potassium: 3.4 mmol/L — ABNORMAL LOW (ref 3.5–5.1)
Sodium: 140 mmol/L (ref 135–145)

## 2015-07-29 LAB — TYPE AND SCREEN
ABO/RH(D): A POS
ANTIBODY SCREEN: NEGATIVE

## 2015-07-29 LAB — SURGICAL PCR SCREEN
MRSA, PCR: NEGATIVE
Staphylococcus aureus: POSITIVE — AB

## 2015-07-29 LAB — CBC
HCT: 46.6 % (ref 39.0–52.0)
Hemoglobin: 15.6 g/dL (ref 13.0–17.0)
MCH: 29.3 pg (ref 26.0–34.0)
MCHC: 33.5 g/dL (ref 30.0–36.0)
MCV: 87.4 fL (ref 78.0–100.0)
Platelets: 189 10*3/uL (ref 150–400)
RBC: 5.33 MIL/uL (ref 4.22–5.81)
RDW: 13.3 % (ref 11.5–15.5)
WBC: 7.9 10*3/uL (ref 4.0–10.5)

## 2015-07-29 LAB — ABO/RH: ABO/RH(D): A POS

## 2015-07-29 NOTE — Progress Notes (Signed)
Patient callled and said that prescription had not been called into his Pharmacy.  [facility] was [facility] [address] [place], [place].  [facility] [address] [place]; [address], [place]. I called the prescription to [address], [address], [place] and [address], [facility] to tell them that thae order was cancelled for that location.

## 2015-07-29 NOTE — Progress Notes (Signed)
I called a prescription for Mupirocin ointment to [address], [place] , [place], [place].

## 2015-08-04 MED ORDER — CEFAZOLIN SODIUM-DEXTROSE 2-4 GM/100ML-% IV SOLN
2.0000 g | INTRAVENOUS | Status: AC
  Administered 2015-08-05: 2 g via INTRAVENOUS
  Filled 2015-08-04: qty 100

## 2015-08-04 NOTE — Anesthesia Preprocedure Evaluation (Addendum)
Anesthesia Evaluation  Patient identified by MRN, date of birth, ID band Patient awake    Reviewed: Allergy & Precautions, NPO status , Patient's Chart, lab work & pertinent test results  History of Anesthesia Complications Negative for: history of anesthetic complications  Airway Mallampati: II  TM Distance: >3 FB Neck ROM: Full    Dental  (+) Teeth Intact, Dental Advisory Given   Pulmonary neg pulmonary ROS,    Pulmonary exam normal breath sounds clear to auscultation       Cardiovascular Exercise Tolerance: Good (-) hypertension(-) Past MI negative cardio ROS Normal cardiovascular exam Rhythm:Regular Rate:Normal     Neuro/Psych Degenerative disc disease RLE radiculopathy    GI/Hepatic negative GI ROS, Neg liver ROS,   Endo/Other  negative endocrine ROS  Renal/GU negative Renal ROS     Musculoskeletal  (+) Arthritis , Osteoarthritis,    Abdominal   Peds  Hematology negative hematology ROS (+)   Anesthesia Other Findings Day of surgery medications reviewed with the patient.  Reproductive/Obstetrics                           Anesthesia Physical Anesthesia Plan  ASA: II  Anesthesia Plan: General   Post-op Pain Management:    Induction: Intravenous  Airway Management Planned: Oral ETT  Additional Equipment:   Intra-op Plan:   Post-operative Plan: Extubation in OR  Informed Consent: I have reviewed the patients History and Physical, chart, labs and discussed the procedure including the risks, benefits and alternatives for the proposed anesthesia with the patient or authorized representative who has indicated his/her understanding and acceptance.   Dental advisory given  Plan Discussed with: CRNA  Anesthesia Plan Comments: (Risks/benefits of general anesthesia discussed with patient including risk of damage to teeth, lips, gum, and tongue, nausea/vomiting, allergic reactions  to medications, and the possibility of heart attack, stroke and death.  All patient questions answered.  Patient wishes to proceed.)        Anesthesia Quick Evaluation

## 2015-08-05 ENCOUNTER — Inpatient Hospital Stay (HOSPITAL_COMMUNITY): Payer: 59 | Admitting: Anesthesiology

## 2015-08-05 ENCOUNTER — Inpatient Hospital Stay (HOSPITAL_COMMUNITY)
Admission: RE | Admit: 2015-08-05 | Discharge: 2015-08-07 | DRG: 460 | Disposition: A | Discharge status: Home / Self Care | Payer: 59 | Source: Ambulatory Visit | Attending: Neurosurgery | Admitting: Neurosurgery

## 2015-08-05 ENCOUNTER — Encounter (HOSPITAL_COMMUNITY)
Admission: RE | Disposition: A | Discharge status: Home / Self Care | Payer: Self-pay | Source: Ambulatory Visit | Attending: Neurosurgery

## 2015-08-05 ENCOUNTER — Encounter (HOSPITAL_COMMUNITY): Payer: Self-pay | Admitting: Anesthesiology

## 2015-08-05 ENCOUNTER — Inpatient Hospital Stay (HOSPITAL_COMMUNITY): Payer: 59

## 2015-08-05 DIAGNOSIS — M5116 Intervertebral disc disorders with radiculopathy, lumbar region: Secondary | ICD-10-CM | POA: Diagnosis present

## 2015-08-05 DIAGNOSIS — Z79899 Other long term (current) drug therapy: Secondary | ICD-10-CM

## 2015-08-05 DIAGNOSIS — M47816 Spondylosis without myelopathy or radiculopathy, lumbar region: Secondary | ICD-10-CM | POA: Diagnosis present

## 2015-08-05 DIAGNOSIS — M4316 Spondylolisthesis, lumbar region: Secondary | ICD-10-CM | POA: Diagnosis present

## 2015-08-05 DIAGNOSIS — M549 Dorsalgia, unspecified: Secondary | ICD-10-CM | POA: Diagnosis present

## 2015-08-05 SURGERY — POSTERIOR LUMBAR FUSION 1 LEVEL
Anesthesia: General | Site: Back

## 2015-08-05 MED ORDER — CEFAZOLIN SODIUM-DEXTROSE 2-4 GM/100ML-% IV SOLN
2.0000 g | Freq: Three times a day (TID) | INTRAVENOUS | Status: AC
  Administered 2015-08-05 (×2): 2 g via INTRAVENOUS
  Filled 2015-08-05 (×2): qty 100

## 2015-08-05 MED ORDER — FENTANYL CITRATE (PF) 100 MCG/2ML IJ SOLN
INTRAMUSCULAR | Status: AC
  Filled 2015-08-05: qty 2

## 2015-08-05 MED ORDER — GABAPENTIN 300 MG PO CAPS
300.0000 mg | ORAL_CAPSULE | Freq: Three times a day (TID) | ORAL | Status: DC
  Administered 2015-08-05 – 2015-08-07 (×7): 300 mg via ORAL
  Filled 2015-08-05 (×8): qty 1

## 2015-08-05 MED ORDER — BUPIVACAINE-EPINEPHRINE (PF) 0.5% -1:200000 IJ SOLN
INTRAMUSCULAR | Status: DC | PRN
  Administered 2015-08-05: 10 mL via PERINEURAL

## 2015-08-05 MED ORDER — OXYCODONE-ACETAMINOPHEN 5-325 MG PO TABS
1.0000 | ORAL_TABLET | ORAL | Status: DC | PRN
  Administered 2015-08-05 – 2015-08-07 (×9): 1 via ORAL
  Filled 2015-08-05 (×9): qty 1

## 2015-08-05 MED ORDER — ONDANSETRON HCL 4 MG/2ML IJ SOLN: 4.0000 mg | INTRAMUSCULAR | Status: DC | PRN

## 2015-08-05 MED ORDER — VANCOMYCIN HCL 1000 MG IV SOLR
INTRAVENOUS | Status: DC | PRN
  Administered 2015-08-05: 1000 mg

## 2015-08-05 MED ORDER — FENTANYL CITRATE (PF) 250 MCG/5ML IJ SOLN
INTRAMUSCULAR | Status: AC
  Filled 2015-08-05: qty 5

## 2015-08-05 MED ORDER — ROCURONIUM BROMIDE 50 MG/5ML IV SOLN
INTRAVENOUS | Status: AC
  Filled 2015-08-05: qty 1

## 2015-08-05 MED ORDER — ACETAMINOPHEN 10 MG/ML IV SOLN
INTRAVENOUS | Status: AC
  Administered 2015-08-05: 1000 mg via INTRAVENOUS
  Filled 2015-08-05: qty 100

## 2015-08-05 MED ORDER — NEOSTIGMINE METHYLSULFATE 5 MG/5ML IV SOSY
PREFILLED_SYRINGE | INTRAVENOUS | Status: AC
  Filled 2015-08-05: qty 5

## 2015-08-05 MED ORDER — ALUM & MAG HYDROXIDE-SIMETH 200-200-20 MG/5ML PO SUSP: 30.0000 mL | Freq: Four times a day (QID) | ORAL | Status: DC | PRN

## 2015-08-05 MED ORDER — BUPIVACAINE LIPOSOME 1.3 % IJ SUSP
20.0000 mL | INTRAMUSCULAR | Status: AC
  Filled 2015-08-05: qty 20

## 2015-08-05 MED ORDER — PROPOFOL 10 MG/ML IV BOLUS
INTRAVENOUS | Status: DC | PRN
  Administered 2015-08-05: 150 mg via INTRAVENOUS

## 2015-08-05 MED ORDER — LIDOCAINE HCL (CARDIAC) 20 MG/ML IV SOLN
INTRAVENOUS | Status: DC | PRN
  Administered 2015-08-05: 50 mg via INTRAVENOUS

## 2015-08-05 MED ORDER — LIDOCAINE 2% (20 MG/ML) 5 ML SYRINGE
INTRAMUSCULAR | Status: AC
  Filled 2015-08-05: qty 5

## 2015-08-05 MED ORDER — MIDAZOLAM HCL 2 MG/2ML IJ SOLN
INTRAMUSCULAR | Status: AC
  Filled 2015-08-05: qty 2

## 2015-08-05 MED ORDER — NEOSTIGMINE METHYLSULFATE 10 MG/10ML IV SOLN
INTRAVENOUS | Status: DC | PRN
  Administered 2015-08-05: 3 mg via INTRAVENOUS

## 2015-08-05 MED ORDER — LACTATED RINGERS IV SOLN
INTRAVENOUS | Status: DC | PRN
  Administered 2015-08-05 (×2): via INTRAVENOUS

## 2015-08-05 MED ORDER — FENTANYL CITRATE (PF) 100 MCG/2ML IJ SOLN
INTRAMUSCULAR | Status: DC | PRN
Start: 2015-08-05 — End: 2015-08-05
  Administered 2015-08-05 (×2): 50 ug via INTRAVENOUS
  Administered 2015-08-05: 150 ug via INTRAVENOUS
  Administered 2015-08-05 (×2): 50 ug via INTRAVENOUS

## 2015-08-05 MED ORDER — OXYCODONE-ACETAMINOPHEN 10-325 MG PO TABS: 0.5000 | ORAL_TABLET | ORAL | Status: DC | PRN

## 2015-08-05 MED ORDER — FENTANYL CITRATE (PF) 100 MCG/2ML IJ SOLN
25.0000 ug | INTRAMUSCULAR | Status: DC | PRN
  Administered 2015-08-05 (×3): 50 ug via INTRAVENOUS

## 2015-08-05 MED ORDER — ROCURONIUM BROMIDE 100 MG/10ML IV SOLN
INTRAVENOUS | Status: DC | PRN
  Administered 2015-08-05: 50 mg via INTRAVENOUS
  Administered 2015-08-05 (×3): 10 mg via INTRAVENOUS

## 2015-08-05 MED ORDER — PROPOFOL 10 MG/ML IV BOLUS
INTRAVENOUS | Status: AC
  Filled 2015-08-05: qty 20

## 2015-08-05 MED ORDER — BUPIVACAINE LIPOSOME 1.3 % IJ SUSP
INTRAMUSCULAR | Status: DC | PRN
  Administered 2015-08-05: 20 mL

## 2015-08-05 MED ORDER — DIAZEPAM 5 MG PO TABS
5.0000 mg | ORAL_TABLET | Freq: Four times a day (QID) | ORAL | Status: DC | PRN
  Administered 2015-08-05 – 2015-08-07 (×6): 5 mg via ORAL
  Filled 2015-08-05 (×6): qty 1

## 2015-08-05 MED ORDER — 0.9 % SODIUM CHLORIDE (POUR BTL) OPTIME
TOPICAL | Status: DC | PRN
  Administered 2015-08-05: 1000 mL

## 2015-08-05 MED ORDER — OXYCODONE HCL 5 MG PO TABS
5.0000 mg | ORAL_TABLET | ORAL | Status: DC | PRN
  Administered 2015-08-05 – 2015-08-07 (×9): 5 mg via ORAL
  Filled 2015-08-05 (×9): qty 1

## 2015-08-05 MED ORDER — PHENOL 1.4 % MT LIQD: 1.0000 | OROMUCOSAL | Status: DC | PRN

## 2015-08-05 MED ORDER — BACITRACIN ZINC 500 UNIT/GM EX OINT
TOPICAL_OINTMENT | CUTANEOUS | Status: DC | PRN
  Administered 2015-08-05: 1 via TOPICAL

## 2015-08-05 MED ORDER — BISACODYL 10 MG RE SUPP: 10.0000 mg | Freq: Every day | RECTAL | Status: DC | PRN

## 2015-08-05 MED ORDER — ONDANSETRON HCL 4 MG/2ML IJ SOLN
INTRAMUSCULAR | Status: DC | PRN
  Administered 2015-08-05: 4 mg via INTRAVENOUS

## 2015-08-05 MED ORDER — MIDAZOLAM HCL 5 MG/5ML IJ SOLN
INTRAMUSCULAR | Status: DC | PRN
  Administered 2015-08-05: 2 mg via INTRAVENOUS

## 2015-08-05 MED ORDER — HYDROCODONE-ACETAMINOPHEN 5-325 MG PO TABS: 1.0000 | ORAL_TABLET | ORAL | Status: DC | PRN

## 2015-08-05 MED ORDER — ACETAMINOPHEN 325 MG PO TABS: 650.0000 mg | ORAL_TABLET | ORAL | Status: DC | PRN

## 2015-08-05 MED ORDER — DOCUSATE SODIUM 100 MG PO CAPS
100.0000 mg | ORAL_CAPSULE | Freq: Two times a day (BID) | ORAL | Status: DC
  Administered 2015-08-05 – 2015-08-07 (×5): 100 mg via ORAL
  Filled 2015-08-05 (×5): qty 1

## 2015-08-05 MED ORDER — SODIUM CHLORIDE 0.9 % IR SOLN
Status: DC | PRN
  Administered 2015-08-05: 500 mL

## 2015-08-05 MED ORDER — MORPHINE SULFATE (PF) 2 MG/ML IV SOLN
1.0000 mg | INTRAVENOUS | Status: DC | PRN
  Administered 2015-08-05 – 2015-08-07 (×3): 4 mg via INTRAVENOUS
  Filled 2015-08-05 (×3): qty 2

## 2015-08-05 MED ORDER — GLYCOPYRROLATE 0.2 MG/ML IJ SOLN
INTRAMUSCULAR | Status: DC | PRN
Start: 2015-08-05 — End: 2015-08-05
  Administered 2015-08-05: .3 mg via INTRAVENOUS

## 2015-08-05 MED ORDER — ACETAMINOPHEN 650 MG RE SUPP: 650.0000 mg | RECTAL | Status: DC | PRN

## 2015-08-05 MED ORDER — DEXAMETHASONE SODIUM PHOSPHATE 10 MG/ML IJ SOLN
INTRAMUSCULAR | Status: DC | PRN
  Administered 2015-08-05: 10 mg via INTRAVENOUS

## 2015-08-05 MED ORDER — VANCOMYCIN HCL 1000 MG IV SOLR
INTRAVENOUS | Status: AC
  Filled 2015-08-05: qty 1000

## 2015-08-05 MED ORDER — SURGIFOAM 100 EX MISC
CUTANEOUS | Status: DC | PRN
  Administered 2015-08-05: 20 mL via TOPICAL

## 2015-08-05 MED ORDER — MENTHOL 3 MG MT LOZG: 1.0000 | LOZENGE | OROMUCOSAL | Status: DC | PRN

## 2015-08-05 MED ORDER — DEXAMETHASONE SODIUM PHOSPHATE 10 MG/ML IJ SOLN
INTRAMUSCULAR | Status: AC
  Filled 2015-08-05: qty 1

## 2015-08-05 MED ORDER — LACTATED RINGERS IV SOLN: INTRAVENOUS | Status: DC

## 2015-08-05 MED ORDER — GLYCOPYRROLATE 0.2 MG/ML IV SOSY
PREFILLED_SYRINGE | INTRAVENOUS | Status: AC
  Filled 2015-08-05: qty 6

## 2015-08-05 SURGICAL SUPPLY — 59 items
BAG DECANTER FOR FLEXI CONT (MISCELLANEOUS) ×2 IMPLANT
BENZOIN TINCTURE PRP APPL 2/3 (GAUZE/BANDAGES/DRESSINGS) ×2 IMPLANT
BLADE CLIPPER SURG (BLADE) IMPLANT
BRUSH SCRUB EZ PLAIN DRY (MISCELLANEOUS) ×2 IMPLANT
BUR MATCHSTICK NEURO 3.0 LAGG (BURR) ×2 IMPLANT
BUR PRECISION FLUTE 6.0 (BURR) ×2 IMPLANT
CANISTER SUCT 3000ML PPV (MISCELLANEOUS) ×2 IMPLANT
CAP REVERE LOCKING (Cap) ×8 IMPLANT
CONT SPEC 4OZ CLIKSEAL STRL BL (MISCELLANEOUS) ×2 IMPLANT
CORDS BIPOLAR (ELECTRODE) ×2 IMPLANT
COVER BACK TABLE 60X90IN (DRAPES) ×2 IMPLANT
DRAPE C-ARM 42X72 X-RAY (DRAPES) ×4 IMPLANT
DRAPE LAPAROTOMY 100X72X124 (DRAPES) ×2 IMPLANT
DRAPE POUCH INSTRU U-SHP 10X18 (DRAPES) ×2 IMPLANT
DRAPE PROXIMA HALF (DRAPES) ×2 IMPLANT
DRAPE SURG 17X23 STRL (DRAPES) ×8 IMPLANT
ELECT BLADE 4.0 EZ CLEAN MEGAD (MISCELLANEOUS) ×4
ELECT REM PT RETURN 9FT ADLT (ELECTROSURGICAL) ×2
ELECTRODE BLDE 4.0 EZ CLN MEGD (MISCELLANEOUS) ×2 IMPLANT
ELECTRODE REM PT RTRN 9FT ADLT (ELECTROSURGICAL) ×1 IMPLANT
EVACUATOR 1/8 PVC DRAIN (DRAIN) IMPLANT
GAUZE SPONGE 4X4 12PLY STRL (GAUZE/BANDAGES/DRESSINGS) ×2 IMPLANT
GAUZE SPONGE 4X4 16PLY XRAY LF (GAUZE/BANDAGES/DRESSINGS) ×2 IMPLANT
GLOVE BIO SURGEON STRL SZ8 (GLOVE) ×4 IMPLANT
GLOVE BIO SURGEON STRL SZ8.5 (GLOVE) ×4 IMPLANT
GLOVE EXAM NITRILE LRG STRL (GLOVE) IMPLANT
GLOVE EXAM NITRILE MD LF STRL (GLOVE) IMPLANT
GLOVE EXAM NITRILE XL STR (GLOVE) IMPLANT
GLOVE EXAM NITRILE XS STR PU (GLOVE) IMPLANT
GOWN STRL REUS W/ TWL LRG LVL3 (GOWN DISPOSABLE) IMPLANT
GOWN STRL REUS W/ TWL XL LVL3 (GOWN DISPOSABLE) ×2 IMPLANT
GOWN STRL REUS W/TWL 2XL LVL3 (GOWN DISPOSABLE) IMPLANT
GOWN STRL REUS W/TWL LRG LVL3 (GOWN DISPOSABLE)
GOWN STRL REUS W/TWL XL LVL3 (GOWN DISPOSABLE) ×2
KIT BASIN OR (CUSTOM PROCEDURE TRAY) ×2 IMPLANT
KIT ROOM TURNOVER OR (KITS) ×2 IMPLANT
NEEDLE HYPO 21X1.5 SAFETY (NEEDLE) IMPLANT
NEEDLE HYPO 22GX1.5 SAFETY (NEEDLE) ×2 IMPLANT
NS IRRIG 1000ML POUR BTL (IV SOLUTION) ×2 IMPLANT
PACK LAMINECTOMY NEURO (CUSTOM PROCEDURE TRAY) ×2 IMPLANT
PAD ARMBOARD 7.5X6 YLW CONV (MISCELLANEOUS) ×6 IMPLANT
PATTIES SURGICAL .5 X1 (DISPOSABLE) IMPLANT
PENCIL BUTTON BLDE SNGL 10FT (ELECTRODE) ×2 IMPLANT
ROD REVERE 6.35 40MM (Rod) ×4 IMPLANT
SCREW 7.5X50MM (Screw) ×4 IMPLANT
SCREW REVERE 6.5X50MM (Screw) ×4 IMPLANT
SPACER ALTERA 10X31 8-12MM-8 (Spacer) ×2 IMPLANT
SPONGE LAP 4X18 X RAY DECT (DISPOSABLE) IMPLANT
SPONGE NEURO XRAY DETECT 1X3 (DISPOSABLE) IMPLANT
SPONGE SURGIFOAM ABS GEL 100 (HEMOSTASIS) ×2 IMPLANT
STRIP BIOACTIVE 20CC 25X100X8 (Miscellaneous) ×2 IMPLANT
STRIP CLOSURE SKIN 1/2X4 (GAUZE/BANDAGES/DRESSINGS) ×2 IMPLANT
SUT VIC AB 1 CT1 18XBRD ANBCTR (SUTURE) ×2 IMPLANT
SUT VIC AB 1 CT1 8-18 (SUTURE) ×2
SUT VIC AB 2-0 CP2 18 (SUTURE) ×4 IMPLANT
TOWEL OR 17X24 6PK STRL BLUE (TOWEL DISPOSABLE) ×2 IMPLANT
TOWEL OR 17X26 10 PK STRL BLUE (TOWEL DISPOSABLE) ×2 IMPLANT
TRAY FOLEY W/METER SILVER 14FR (SET/KITS/TRAYS/PACK) ×2 IMPLANT
WATER STERILE IRR 1000ML POUR (IV SOLUTION) ×2 IMPLANT

## 2015-08-05 NOTE — Progress Notes (Signed)
Patient ID: Derrick Joyce, male   DOB: 06/16/1970, 45 y.o.   MRN: [id number] Subjective:  I saw the patient in the PACU. He was somnolent but arousable. He was in no apparent distress.  Objective: Vital signs in last 24 hours: Temp:  [97.3 F (36.3 C)-98.3 F (36.8 C)] 97.8 F (36.6 C) (05/22 1627) Pulse Rate:  [57-101] 89 (05/22 1627) Resp:  [13-48] 18 (05/22 1627) BP: (117-132)/(67-89) 117/67 mmHg (05/22 1627) SpO2:  [97 %-99 %] 97 % (05/22 1627)  Intake/Output from previous day:   Intake/Output this shift: Total I/O In: 2360 [P.O.:360; I.V.:2000] Out: 1400 [Urine:1200; Blood:200]  Physical exam the patient is somnolent but arousable. He was moving his lower extremities well.  Lab Results: No results for input(s): WBC, HGB, HCT, PLT in the last 72 hours. BMET No results for input(s): NA, K, CL, CO2, GLUCOSE, BUN, CREATININE, CALCIUM in the last 72 hours.  Studies/Results: Dg Lumbar Spine 2-3 Views  08/05/2015  CLINICAL DATA:  Posterior fusion L4-L5, back pain EXAM: LUMBAR SPINE - 2-3 VIEW; DG C-ARM 61-120 MIN COMPARISON:  Intraoperative lumbar radiographs 08/05/2015, MRI lumbar spine 06/14/2015 FLUOROSCOPY TIME:  0 minutes 20.9 seconds Cumulative dose:  15.72 mGy Submitted images: 3 FINDINGS: Five lumbar vertebra labeled on intraoperative images as well as preceding MRI lumbar spine BILATERAL pedicle screws at L4 and L5. Disc prosthesis L4-L5 disc space. Disc space narrowing with endplate spur formation L5-S1. Bones appear demineralized. IMPRESSION: Intraoperative images during posterior fusion L4-L5 as above. Electronically Signed   By: Ulyses SouthwardMark  Boles M.D.   On: 08/05/2015 10:39   Dg Lumbar Spine 1 View  08/05/2015  CLINICAL DATA:  Intraoperative localization for L4-5 PLIF EXAM: LUMBAR SPINE - 1 VIEW COMPARISON:  None. FINDINGS: Lateral radiograph of the lumbar spine was obtained and reveals a surgical retractor and surgical instruments at the L4-5 level. Standard numbering  nomenclature is utilized. Disc space narrowing is seen at L4-5 and L5-S1. IMPRESSION: Intraoperative localization at L4-5. Electronically Signed   By: Alcide CleverMark  Lukens M.D.   On: 08/05/2015 10:02   Dg C-arm 1-60 Min  08/05/2015  CLINICAL DATA:  Posterior fusion L4-L5, back pain EXAM: LUMBAR SPINE - 2-3 VIEW; DG C-ARM 61-120 MIN COMPARISON:  Intraoperative lumbar radiographs 08/05/2015, MRI lumbar spine 06/14/2015 FLUOROSCOPY TIME:  0 minutes 20.9 seconds Cumulative dose:  15.72 mGy Submitted images: 3 FINDINGS: Five lumbar vertebra labeled on intraoperative images as well as preceding MRI lumbar spine BILATERAL pedicle screws at L4 and L5. Disc prosthesis L4-L5 disc space. Disc space narrowing with endplate spur formation L5-S1. Bones appear demineralized. IMPRESSION: Intraoperative images during posterior fusion L4-L5 as above. Electronically Signed   By: Ulyses SouthwardMark  Boles M.D.   On: 08/05/2015 10:39    Assessment/Plan: The patient is doing well.  LOS: 0 days     Jolayne Branson D 08/05/2015, 4:30 PM

## 2015-08-05 NOTE — Anesthesia Postprocedure Evaluation (Signed)
Anesthesia Post Note  Patient: Derrick Joyce  Procedure(s) Performed: Procedure(s) (LRB): POSTERIOR LUMBAR FUSION 1 LEVEL (N/A)  Patient location during evaluation: PACU Anesthesia Type: General Level of consciousness: awake and alert Pain management: pain level controlled Vital Signs Assessment: post-procedure vital signs reviewed and stable Respiratory status: spontaneous breathing, nonlabored ventilation, respiratory function stable and patient connected to nasal cannula oxygen Cardiovascular status: blood pressure returned to baseline and stable Postop Assessment: no signs of nausea or vomiting Anesthetic complications: no    Last Vitals:  Filed Vitals:   08/05/15 1200 08/05/15 1206  BP:  124/89  Pulse: 83 97  Temp:    Resp: 13 17    Last Pain:  Filed Vitals:   08/05/15 1207  PainSc: Asleep                 Cecile HearingStephen Edward Zalma Channing

## 2015-08-05 NOTE — Anesthesia Procedure Notes (Signed)
Procedure Name: Intubation Date/Time: 08/05/2015 7:35 AM Performed by: Carmela RimaMARTINELLI, Alondra Vandeven F Pre-anesthesia Checklist: Patient being monitored, Suction available, Emergency Drugs available, Patient identified and Timeout performed Patient Re-evaluated:Patient Re-evaluated prior to inductionOxygen Delivery Method: Circle system utilized Preoxygenation: Pre-oxygenation with 100% oxygen Intubation Type: IV induction Ventilation: Mask ventilation without difficulty Laryngoscope Size: Mac and 3 Grade View: Grade I Tube type: Oral Tube size: 7.5 mm Number of attempts: 1 Placement Confirmation: ETT inserted through vocal cords under direct vision,  positive ETCO2 and breath sounds checked- equal and bilateral Secured at: 23 cm Tube secured with: Tape Dental Injury: Teeth and Oropharynx as per pre-operative assessment

## 2015-08-05 NOTE — H&P (Signed)
Subjective: The patient is a 45 year old white male on whom I performed an L4-5 discectomy. Initially did well but has developed recurrent back and leg pain. He has failed medical management. He was worked up with a lumbar MRI which demonstrated a recurrent ruptured discs and disc degeneration at L4-5. I discussed the various treatment options. The patient has decided to proceed with surgery.   Past Medical History  Diagnosis Date  . Kidney stones     Past Surgical History  Procedure Laterality Date  . Hernia repair    . Appendectomy    . Tympanoplasty    . Back surgery      No Known Allergies  Social History  Substance Use Topics  . Smoking status: Never Smoker   . Smokeless tobacco: Not on file  . Alcohol Use: No     Comment: occ    History reviewed. No pertinent family history. Prior to Admission medications   Medication Sig Start Date End Date Taking? Authorizing Provider  gabapentin (NEURONTIN) 300 MG capsule Take 300 mg by mouth 3 (three) times daily. 05/31/15  Yes Historical Provider, MD  ibuprofen (ADVIL,MOTRIN) 400 MG tablet Take 400 mg by mouth every 6 (six) hours as needed for mild pain.   Yes Historical Provider, MD  oxyCODONE-acetaminophen (PERCOCET) 10-325 MG tablet Take 0.5-1 tablets by mouth every 6 (six) hours as needed for pain.  07/11/15  Yes Historical Provider, MD     Review of Systems  Positive ROS: As above  All other systems have been reviewed and were otherwise negative with the exception of those mentioned in the HPI and as above.  Objective: Vital signs in last 24 hours: Temp:  [98.3 F (36.8 C)] 98.3 F (36.8 C) (05/22 0622) Pulse Rate:  [57] 57 (05/22 0622) Resp:  [16] 16 (05/22 0622) BP: (129)/(85) 129/85 mmHg (05/22 0622) SpO2:  [98 %] 98 % (05/22 0622)  General Appearance: Alert, cooperative, no distress, Head: Normocephalic, without obvious abnormality, atraumatic Eyes: PERRL, conjunctiva/corneas clear, EOM's intact,    Ears: Normal   Throat: Normal  Neck: Supple, symmetrical, trachea midline, no adenopathy; thyroid: No enlargement/tenderness/nodules; no carotid bruit or JVD Back: Symmetric, no curvature, ROM normal, no CVA tenderness Lungs: Clear to auscultation bilaterally, respirations unlabored Heart: Regular rate and rhythm, no murmur, rub or gallop Abdomen: Soft, non-tender,, no masses, no organomegaly Extremities: Extremities normal, atraumatic, no cyanosis or edema Pulses: 2+ and symmetric all extremities Skin: Skin color, texture, turgor normal, no rashes or lesions  NEUROLOGIC:   Mental status: alert and oriented, no aphasia, good attention span, Fund of knowledge/ memory ok Motor Exam - grossly normal Sensory Exam - grossly normal Reflexes:  Coordination - grossly normal Gait - grossly normal Balance - grossly normal Cranial Nerves: I: smell Not tested  II: visual acuity  OS: Normal  OD: Normal   II: visual fields Full to confrontation  II: pupils Equal, round, reactive to light  III,VII: ptosis None  III,IV,VI: extraocular muscles  Full ROM  V: mastication Normal  V: facial light touch sensation  Normal  V,VII: corneal reflex  Present  VII: facial muscle function - upper  Normal  VII: facial muscle function - lower Normal  VIII: hearing Not tested  IX: soft palate elevation  Normal  IX,X: gag reflex Present  XI: trapezius strength  5/5  XI: sternocleidomastoid strength 5/5  XI: neck flexion strength  5/5  XII: tongue strength  Normal    Data Review Lab Results  Component Value Date  WBC 7.9 07/29/2015   HGB 15.6 07/29/2015   HCT 46.6 07/29/2015   MCV 87.4 07/29/2015   PLT 189 07/29/2015   Lab Results  Component Value Date   NA 140 07/29/2015   K 3.4* 07/29/2015   CL 104 07/29/2015   CO2 27 07/29/2015   BUN 9 07/29/2015   CREATININE 0.89 07/29/2015   GLUCOSE 130* 07/29/2015   No results found for: INR, PROTIME  Assessment/Plan: L4-5 degenerative disc disease, recurrent  ruptured disc, lumbago, lumbar radiculopathy: I have discussed the situation with the patient. I have reviewed his imaging studies with him and pointed out that abnormalities. We have discussed the various treatment options including surgery. I have described the surgical treatment option of an L4-5 decompression, instrumentation, and fusion. I have shown him surgical models. We have discussed the risks, benefits, alternatives, and likelihood of achieving her goals with surgery. I have answered all his questions. He has decided to proceed with surgery.   Romain Erion D 08/05/2015 7:24 AM

## 2015-08-05 NOTE — Op Note (Signed)
Brief history: The patient is a 45 year old white male on whom I performed an L4-5 initially did well but then developed recurrent back, buttock, and right leg pain consistent with a lumbar radiculopathy. He failed medical management and was worked up with a lumbar MRI. This demonstrated this degeneration and recurrent ruptured disc at L4-5. I discussed the various treatment options including surgery. The patient has weighed the risks, benefits, and alternative surgery decided to proceed with an L4-5 decompression, instrumentation, and fusion.  Preoperative diagnosis: L4-5 recurrent herniated disc, Degenerative disc disease,  lumbago; lumbar radiculopathy  Postoperative diagnosis: The same  Procedure: Redo right L4-5 Laminotomy/foraminotomies to remove the recurrent ruptured disc and decompress the right L5 nerve roots; right L4-5 transforaminallumbar interbody fusion with local morselized autograft bone and Kinnex graft extender; insertion of interbody prosthesis at L4-5 (globus peek expandable interbody prosthesis); posterior nonsegmental instrumentation from L4 to L5 with globus titanium pedicle screws and rods; posterior lateral arthrodesis at L4-5 with local morselized autograft bone and Kinnex bone graft extender.  Surgeon: Dr. Delma Officer  Asst.: Dr. Daryl Eastern  Anesthesia: Gen. endotracheal  Estimated blood loss: 200 mL  Drains: None  Complications: None  Description of procedure: The patient was brought to the operating room by the anesthesia team. General endotracheal anesthesia was induced. The patient was turned to the prone position on the Wilson frame. The patient's lumbosacral region was then prepared with Betadine scrub and Betadine solution. Sterile drapes were applied.  I then injected the area to be incised with Marcaine with epinephrine solution. I then used the scalpel to make a linear midline incision over the L4-5 interspace, incising through his old surgical scar. I then  used electrocautery to perform a bilateral subperiosteal dissection exposing the spinous process and lamina of L4 and L5. We then obtained intraoperative radiograph to confirm our location. We then inserted the Verstrac retractor to provide exposure.  I began the decompression by using the high speed drill to perform a right redo L4-5 laminotomy and foraminotomy. We then used the Kerrison punches to widen the laminotomy and removed the scar tissue and ligamentum flavum at L4-5 on the right. We used the Kerrison punches to remove the medial facets at L4-5 on the right. I used microdissection to discectomy scar tissue and free up the L5 nerve root. We exposed the recurrent ruptured disc. I removed multiple fragments using the pituitary forceps decompressing the right L5 nerve root.  We now turned our attention to the posterior lumbar interbody fusion. I used a scalpel to incise the intervertebral disc at L4-5 on the right. I then performed a partial intervertebral discectomy at L4-5 on the right using the pituitary forceps. We prepared the vertebral endplates at L4-5 for the fusion by removing the soft tissues with the curettes. We then used the trial spacers to pick the appropriate sized interbody prosthesis. We prefilled his prosthesis with a combination of local morselized autograft bone that we obtained during the decompression as well as Kinnex bone graft extender. We inserted the prefilled prosthesis into the interspace at L4-5 from the right, I then expanded the prosthesis. There was a good snug fit of the prosthesis in the interspace. We then filled and the remainder of the intervertebral disc space with local morselized autograft bone and Kinnex. This completed the posterior lumbar interbody arthrodesis.  We now turned attention to the instrumentation. Under fluoroscopic guidance we cannulated the bilateral L4 and L5 pedicles with the bone probe. We then removed the bone probe. We  then tapped the  pedicle with a 6.5 millimeter tap. We then removed the tap. We probed inside the tapped pedicle with a ball probe to rule out cortical breaches. We then inserted a 6.5 and 7.5 x 50 millimeter pedicle screw into the L4 and L5 pedicles bilaterally under fluoroscopic guidance. We then palpated along the medial aspect of the pedicles to rule out cortical breaches. There were none. The nerve roots were not injured. We then connected the unilateral pedicle screws with a lordotic rod. We compressed the construct and secured the rod in place with the caps. We then tightened the caps appropriately. This completed the instrumentation from L4-5.  We now turned our attention to the posterior lateral arthrodesis at L4-5. We used the high-speed drill to decorticate the remainder of the facets, pars, transverse process at L4-5. We then applied a combination of local morselized autograft bone and Kinnex bone graft extender over these decorticated posterior lateral structures. This completed the posterior lateral arthrodesis.  We then obtained hemostasis using bipolar electrocautery. We irrigated the wound out with bacitracin solution. We inspected the thecal sac and nerve roots and noted they were well decompressed. We then removed the retractor. We placed vancomycin powder in the wound. We reapproximated patient's thoracolumbar fascia with interrupted #1 Vicryl suture. We reapproximated patient's subcutaneous tissue with interrupted 2-0 Vicryl suture. The reapproximated patient's skin with Steri-Strips and benzoin. The wound was then coated with bacitracin ointment. A sterile dressing was applied. The drapes were removed. The patient was subsequently returned to the supine position where they were extubated by the anesthesia team. He was then transported to the post anesthesia care unit in stable condition. All sponge instrument and needle counts were reportedly correct at the end of this case.

## 2015-08-05 NOTE — Transfer of Care (Signed)
Immediate Anesthesia Transfer of Care Note  Patient: Derrick Joyce  Procedure(s) Performed: Procedure(s) with comments: POSTERIOR LUMBAR FUSION 1 LEVEL (N/A) - L45 decompression with posterior lumbar interbody fusion with interbody prosthesis posterior lateral arthrodesis and posterior nonsegmental instrumentation  Patient Location: PACU  Anesthesia Type:General  Level of Consciousness: awake, alert  and oriented  Airway & Oxygen Therapy: Patient Spontanous Breathing and Patient connected to face mask oxygen  Post-op Assessment: Report given to RN, Post -op Vital signs reviewed and stable and Patient moving all extremities X 4  Post vital signs: Reviewed and stable  Last Vitals:  Filed Vitals:   08/05/15 0622  BP: 129/85  Pulse: 57  Temp: 36.8 C  Resp: 16    Last Pain: There were no vitals filed for this visit.       Complications: No apparent anesthesia complications

## 2015-08-06 LAB — CBC
HEMATOCRIT: 46.9 % (ref 39.0–52.0)
HEMOGLOBIN: 15.8 g/dL (ref 13.0–17.0)
MCH: 29.3 pg (ref 26.0–34.0)
MCHC: 33.7 g/dL (ref 30.0–36.0)
MCV: 87 fL (ref 78.0–100.0)
Platelets: 219 10*3/uL (ref 150–400)
RBC: 5.39 MIL/uL (ref 4.22–5.81)
RDW: 13.6 % (ref 11.5–15.5)
WBC: 18.7 10*3/uL — ABNORMAL HIGH (ref 4.0–10.5)

## 2015-08-06 LAB — BASIC METABOLIC PANEL
Anion gap: 13 (ref 5–15)
BUN: 9 mg/dL (ref 6–20)
CHLORIDE: 99 mmol/L — AB (ref 101–111)
CO2: 23 mmol/L (ref 22–32)
CREATININE: 0.9 mg/dL (ref 0.61–1.24)
Calcium: 9.9 mg/dL (ref 8.9–10.3)
GFR calc Af Amer: >60 mL/min (ref >60)
GFR calc non Af Amer: >60 mL/min (ref >60)
Glucose, Bld: 141 mg/dL — ABNORMAL HIGH (ref 65–99)
Potassium: 4.4 mmol/L (ref 3.5–5.1)
Sodium: 135 mmol/L (ref 135–145)

## 2015-08-06 MED ORDER — CYCLOBENZAPRINE HCL 10 MG PO TABS: 10.0000 mg | ORAL_TABLET | Freq: Three times a day (TID) | ORAL | Status: DC | PRN

## 2015-08-06 MED ORDER — OXYCODONE-ACETAMINOPHEN 10-325 MG PO TABS: 1.0000 | ORAL_TABLET | ORAL | Status: DC | PRN

## 2015-08-06 MED ORDER — DOCUSATE SODIUM 100 MG PO CAPS: 100.0000 mg | ORAL_CAPSULE | Freq: Two times a day (BID) | ORAL | Status: DC

## 2015-08-06 MED FILL — Heparin Sodium (Porcine) Inj 1000 Unit/ML: INTRAMUSCULAR | Qty: 30 | Status: AC

## 2015-08-06 MED FILL — Sodium Chloride IV Soln 0.9%: INTRAVENOUS | Qty: 1000 | Status: AC

## 2015-08-06 NOTE — Discharge Summary (Signed)
Physician Discharge Summary  Patient ID: Derrick Joyce MRN: [id number] DOB/AGE: 43/05/1970 43 y.o.  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 The surgery went well.  The patient's postoperative course was unremarkable. On postoperative day #1 the patient requested discharge to home. He was given written and oral discharge instructions. Follow his questions were answered.  Consults: Physical therapy Significant Diagnostic Studies: None Treatments: L4-5 decompression, instrumentation, and fusion. Discharge Exam: Blood pressure 123/72, pulse 94, temperature 97.7 F (36.5 C), temperature source Oral, resp. rate 18, SpO2 97 %. The patient is alert and pleasant. He looks well. His strength is grossly normal in his lower extremities.  Disposition: Home  Discharge Instructions    Call MD for:  difficulty breathing, headache or visual disturbances    Complete by:  As directed      Call MD for:  extreme fatigue    Complete by:  As directed      Call MD for:  hives    Complete by:  As directed      Call MD for:  persistant dizziness or light-headedness    Complete by:  As directed      Call MD for:  persistant nausea and vomiting    Complete by:  As directed      Call MD for:  redness, tenderness, or signs of infection (pain, swelling, redness, odor or green/yellow discharge around incision site)    Complete by:  As directed      Call MD for:  severe uncontrolled pain    Complete by:  As directed      Call MD for:  temperature >100.4    Complete by:  As directed      Diet - low sodium heart healthy    Complete by:  As directed      Discharge instructions    Complete by:  As directed   Call [phone] for a followup appointment. Take a stool softener while you are using pain medications.     Driving Restrictions    Complete by:  As directed   Do not drive for 2 weeks.     Increase activity slowly    Complete by:  As directed      Lifting restrictions    Complete by:  As directed   Do not lift more than 5 pounds. No excessive bending or twisting.     May shower / Bathe    Complete by:  As directed   He may shower after the pain she is removed 3 days after surgery. Leave the incision alone.     Remove dressing in 48 hours    Complete by:  As directed   Your stitches are under the scan and will dissolve by themselves. The Steri-Strips will fall off after you take a few showers. Do not rub back or pick at the wound, Leave the wound alone.            Medication List    STOP taking these medications        ibuprofen 400 MG tablet  Commonly known as:  ADVIL,MOTRIN      TAKE these medications        cyclobenzaprine 10 MG tablet  Commonly known  as:  FLEXERIL  Take 1 tablet (10 mg total) by mouth 3 (three) times daily as needed.     docusate sodium 100 MG capsule  Commonly known as:  COLACE  Take 1 capsule (100 mg total) by mouth 2 (two) times daily.     gabapentin 300 MG capsule  Commonly known as:  NEURONTIN  Take 300 mg by mouth 3 (three) times daily.     oxyCODONE-acetaminophen 10-325 MG tablet  Commonly known as:  PERCOCET  Take 1 tablet by mouth every 4 (four) hours as needed for pain.         SignedCristi Loron 08/06/2015, 7:53 AM

## 2015-08-06 NOTE — Evaluation (Signed)
Physical Therapy Evaluation Patient Details Name: Derrick Joyce MRN: [id number] DOB: 03/21/1970 Today's Date: 08/06/2015   History of Present Illness  Pt is a 45 y/o male who presents s/p L4-L5 PLIF on 08/05/15.  Clinical Impression  Patient evaluated by Physical Therapy with no further acute PT needs identified. All education has been completed and the patient has no further questions. At the time of PT eval pt was able to perform transfers and ambulation with modified independence. Pt appears to have adequate assist and equipment available to him at home. Pt is appropriate for d/c home today from a mobility standpoint. See below for any follow-up Physical Therapy or equipment needs. PT is signing off. Thank you for this referral.     Follow Up Recommendations Outpatient PT;Supervision for mobility/OOB    Equipment Recommendations  None recommended by PT    Recommendations for Other Services       Precautions / Restrictions Precautions Precautions: Fall;Back Precaution Booklet Issued: Yes (comment) Precaution Comments: Reviewed handout and pt was cued for precautions during funtional mobility.  Required Braces or Orthoses: Spinal Brace Spinal Brace: Lumbar corset;Applied in sitting position Restrictions Weight Bearing Restrictions: No      Mobility  Bed Mobility               General bed mobility comments: Pt received standing in room upon PT arrival.   Transfers Overall transfer level: Modified independent Equipment used: None Transfers: Sit to/from Stand           General transfer comment: Proper hand placement and safety awareness demonstrated.   Ambulation/Gait Ambulation/Gait assistance: Modified independent (Device/Increase time) Ambulation Distance (Feet): 400 Feet Assistive device: None Gait Pattern/deviations: Step-through pattern;Decreased stride length;Trunk flexed Gait velocity: Decreased Gait velocity interpretation: Below normal speed for  age/gender General Gait Details: Slow but steady. Pt was able to improve gait speed by end of gait training but continued to appear guarded. No unsteadiness noted.   Stairs            Wheelchair Mobility    Modified Rankin (Stroke Patients Only)       Balance Overall balance assessment: Needs assistance Sitting-balance support: Feet supported;No upper extremity supported Sitting balance-Leahy Scale: Good     Standing balance support: No upper extremity supported;During functional activity Standing balance-Leahy Scale: Fair                               Pertinent Vitals/Pain Pain Assessment: Faces Faces Pain Scale: Hurts little more Pain Location: Incision Pain Descriptors / Indicators: Operative site guarding;Sore Pain Intervention(s): Limited activity within patient's tolerance;Monitored during session;Repositioned    Home Living Family/patient expects to be discharged to:: Private residence Living Arrangements: Spouse/significant other Available Help at Discharge: Family;Available 24 hours/day Type of Home: House Home Access: Level entry     Home Layout: One level Home Equipment: Shower seat - built in;Bedside commode Additional Comments: Pt ordered a BSC and will be here thursday    Prior Function Level of Independence: Independent               Hand Dominance   Dominant Hand: Right    Extremity/Trunk Assessment   Upper Extremity Assessment: Defer to OT evaluation           Lower Extremity Assessment: Generalized weakness      Cervical / Trunk Assessment: Other exceptions  Communication   Communication: No difficulties  Cognition Arousal/Alertness: Awake/alert Behavior During Therapy:  WFL for tasks assessed/performed Overall Cognitive Status: Within Functional Limits for tasks assessed                      General Comments      Exercises        Assessment/Plan    PT Assessment Patent does not need any  further PT services  PT Diagnosis Difficulty walking;Acute pain   PT Problem List    PT Treatment Interventions     PT Goals (Current goals can be found in the Care Plan section) Acute Rehab PT Goals Patient Stated Goal: Home today PT Goal Formulation: All assessment and education complete, DC therapy    Frequency     Barriers to discharge        Co-evaluation               End of Session Equipment Utilized During Treatment: Back brace Activity Tolerance: Patient tolerated treatment well Patient left: in chair;with call bell/phone within reach Nurse Communication: Mobility status         Time: 1610-9604 PT Time Calculation (min) (ACUTE ONLY): 12 min   Charges:   PT Evaluation $PT Eval Moderate Complexity: 1 Procedure     PT G CodesConni Slipper Aug 15, 2015, 9:53 AM   Conni Slipper, PT, DPT Acute Rehabilitation Services Pager: [phone]

## 2015-08-07 NOTE — Discharge Instructions (Signed)

## 2015-08-07 NOTE — Progress Notes (Signed)
Pt doing well. Pt and wife given D/C instructions with Rx's, verbal understanding was provided. Pt's incision is clean and dry with no sign of infection. Pt's IV was removed prior to D/C. Pt D/C'd home via walking @ 1845 per MD order. Pt is stable @ D/C and has no other needs at this time. Rema FendtAshley Braedan Meuth, RN

## 2015-08-07 NOTE — Progress Notes (Signed)
Patient ID: Derrick Joyce, male   DOB: 12/17/1970, 45 y.o.   MRN: [id number] Subjective:  The patient is alert and pleasant. He feels much more sore today. He thinks he might have overdone it a bit yesterday.  Objective: Vital signs in last 24 hours: Temp:  [97.9 F (36.6 C)-98.3 F (36.8 C)] 98.3 F (36.8 C) (05/24 0758) Pulse Rate:  [81-93] 85 (05/24 0758) Resp:  [18-20] 18 (05/24 0758) BP: (92-135)/(76-86) 135/86 mmHg (05/24 0758) SpO2:  [96 %-98 %] 96 % (05/24 0758)  Intake/Output from previous day: 05/23 0701 - 05/24 0700 In: 1080 [P.O.:1080] Out: -  Intake/Output this shift:    Physical exam the patient is alert and pleasant. His strength is grossly normal lower extremities.  Lab Results:  Recent Labs  08/06/15 0404  WBC 18.7*  HGB 15.8  HCT 46.9  PLT 219   BMET  Recent Labs  08/06/15 0404  NA 135  K 4.4  CL 99*  CO2 23  GLUCOSE 141*  BUN 9  CREATININE 0.90  CALCIUM 9.9    Studies/Results: Dg Lumbar Spine 2-3 Views  08/05/2015  CLINICAL DATA:  Posterior fusion L4-L5, back pain EXAM: LUMBAR SPINE - 2-3 VIEW; DG C-ARM 61-120 MIN COMPARISON:  Intraoperative lumbar radiographs 08/05/2015, MRI lumbar spine 06/14/2015 FLUOROSCOPY TIME:  0 minutes 20.9 seconds Cumulative dose:  15.72 mGy Submitted images: 3 FINDINGS: Five lumbar vertebra labeled on intraoperative images as well as preceding MRI lumbar spine BILATERAL pedicle screws at L4 and L5. Disc prosthesis L4-L5 disc space. Disc space narrowing with endplate spur formation L5-S1. Bones appear demineralized. IMPRESSION: Intraoperative images during posterior fusion L4-L5 as above. Electronically Signed   By: Ulyses SouthwardMark  Boles M.D.   On: 08/05/2015 10:39   Dg C-arm 1-60 Min  08/05/2015  CLINICAL DATA:  Posterior fusion L4-L5, back pain EXAM: LUMBAR SPINE - 2-3 VIEW; DG C-ARM 61-120 MIN COMPARISON:  Intraoperative lumbar radiographs 08/05/2015, MRI lumbar spine 06/14/2015 FLUOROSCOPY TIME:  0 minutes 20.9 seconds  Cumulative dose:  15.72 mGy Submitted images: 3 FINDINGS: Five lumbar vertebra labeled on intraoperative images as well as preceding MRI lumbar spine BILATERAL pedicle screws at L4 and L5. Disc prosthesis L4-L5 disc space. Disc space narrowing with endplate spur formation L5-S1. Bones appear demineralized. IMPRESSION: Intraoperative images during posterior fusion L4-L5 as above. Electronically Signed   By: Ulyses SouthwardMark  Boles M.D.   On: 08/05/2015 10:39    Assessment/Plan: Postop day #2: The patient may go home later today. I have answered all his questions.  LOS: 2 days     Sherriann Szuch D 08/07/2015, 8:46 AM

## 2016-09-23 DIAGNOSIS — E1169 Type 2 diabetes mellitus with other specified complication: Secondary | ICD-10-CM | POA: Insufficient documentation

## 2016-09-23 DIAGNOSIS — E11622 Type 2 diabetes mellitus with other skin ulcer: Secondary | ICD-10-CM | POA: Insufficient documentation

## 2016-09-23 DIAGNOSIS — E119 Type 2 diabetes mellitus without complications: Secondary | ICD-10-CM | POA: Insufficient documentation

## 2016-09-24 DIAGNOSIS — A419 Sepsis, unspecified organism: Secondary | ICD-10-CM | POA: Insufficient documentation

## 2016-09-24 DIAGNOSIS — L97929 Non-pressure chronic ulcer of unspecified part of left lower leg with unspecified severity: Secondary | ICD-10-CM | POA: Insufficient documentation

## 2016-09-29 DIAGNOSIS — L950 Livedoid vasculitis: Secondary | ICD-10-CM | POA: Insufficient documentation

## 2016-09-29 DIAGNOSIS — I878 Other specified disorders of veins: Secondary | ICD-10-CM | POA: Insufficient documentation

## 2018-01-27 DIAGNOSIS — M545 Low back pain, unspecified: Secondary | ICD-10-CM | POA: Insufficient documentation

## 2018-12-12 ENCOUNTER — Ambulatory Visit: Payer: Self-pay

## 2018-12-12 ENCOUNTER — Other Ambulatory Visit: Payer: Self-pay

## 2018-12-12 DIAGNOSIS — Z20822 Contact with and (suspected) exposure to covid-19: Secondary | ICD-10-CM

## 2018-12-12 DIAGNOSIS — R0602 Shortness of breath: Secondary | ICD-10-CM

## 2018-12-12 NOTE — Progress Notes (Signed)
   Subjective:    Patient ID: Derrick Joyce, male    DOB: 02-Feb-1971, 48 y.o.   MRN: [id number]  Presented to clinic with c/o shortness of breath; noted to be new onset. States his wife has been quarantined at home d/t possible COVID exposure. Sent for testing at this time.     Review of Systems     Objective:   Physical Exam  Assessment & Plan:   Wt Readings from Last 3 Encounters:  07/29/15 183 lb 4.8 oz (83.1 kg)  08/18/14 181 lb (82.1 kg)  08/09/14 181 lb (82.1 kg)   Temp Readings from Last 3 Encounters:  08/07/15 98.3 F (36.8 C) (Oral)  07/29/15 97.9 F (36.6 C)  08/18/14 98.4 F (36.9 C) (Oral)   BP Readings from Last 3 Encounters:  08/07/15 116/75  07/29/15 (!) 141/58  08/18/14 136/83   Pulse Readings from Last 3 Encounters:  08/07/15 (!) 102  07/29/15 79  08/18/14 81     No results found for: POCGLU         Thank you!!  [place] Nurse Specialist [place]: [phone]  Cell:  [phone] Website: [email or web address]

## 2018-12-13 LAB — NOVEL CORONAVIRUS, NAA: SARS-CoV-2, NAA: NOT DETECTED

## 2018-12-13 LAB — SPECIMEN STATUS REPORT

## 2018-12-14 ENCOUNTER — Telehealth: Payer: Self-pay

## 2018-12-14 NOTE — Telephone Encounter (Signed)
Received return call. Updated patient with Negative COVID-19 test results. No further symptoms noted at this time. Cleared to return to work.

## 2018-12-14 NOTE — Telephone Encounter (Signed)
Placed call to patient to give updated COVID-19 results. Left message to return call.

## 2019-06-01 ENCOUNTER — Ambulatory Visit: Payer: Self-pay | Attending: Internal Medicine

## 2019-06-01 DIAGNOSIS — Z23 Encounter for immunization: Secondary | ICD-10-CM

## 2019-06-01 NOTE — Progress Notes (Signed)
   Covid-19 Vaccination Clinic  Name:  Cobie Marcoux III    MRN: [id number] DOB: 11-18-1970  06/01/2019  Mr. Plagge was observed post Covid-19 immunization for 15 minutes without incident. He was provided with Vaccine Information Sheet and instruction to access the V-Safe system.   Mr. Kaufhold was instructed to call 911 with any severe reactions post vaccine: Marland Kitchen Difficulty breathing  . Swelling of face and throat  . A fast heartbeat  . A bad rash all over body  . Dizziness and weakness   Immunizations Administered    Name Date Dose VIS Date Route   Pfizer COVID-19 Vaccine 06/01/2019 10:10 AM 0.3 mL 02/24/2019 Intramuscular   Manufacturer: [employer], [employer]   Lot: [id number]   NDC: [address]-8

## 2019-06-27 ENCOUNTER — Ambulatory Visit: Payer: Self-pay | Attending: Internal Medicine

## 2019-06-27 DIAGNOSIS — Z23 Encounter for immunization: Secondary | ICD-10-CM

## 2019-06-27 NOTE — Progress Notes (Signed)
   Covid-19 Vaccination Clinic  Name:  Derrick Joyce    MRN: [id number] DOB: 1970-11-19  06/27/2019  Mr. [name] was observed post Covid-19 immunization for 15 minutes without incident. He was provided with Vaccine Information Sheet and instruction to access the V-Safe system.   Mr. [name] was instructed to call 911 with any severe reactions post vaccine: Marland Kitchen Difficulty breathing  . Swelling of face and throat  . A fast heartbeat  . A bad rash all over body  . Dizziness and weakness   Immunizations Administered    Name Date Dose VIS Date Route   Pfizer COVID-19 Vaccine 06/27/2019  9:38 AM 0.3 mL 02/24/2019 Intramuscular   Manufacturer: [employer], [employer]   Lot: [id number]   NDC: [address]-3

## 2020-03-26 ENCOUNTER — Other Ambulatory Visit: Payer: Self-pay

## 2020-03-26 DIAGNOSIS — Z20822 Contact with and (suspected) exposure to covid-19: Secondary | ICD-10-CM

## 2020-03-29 LAB — NOVEL CORONAVIRUS, NAA: SARS-CoV-2, NAA: DETECTED — AB

## 2021-04-16 ENCOUNTER — Emergency Department
Admission: EM | Admit: 2021-04-16 | Discharge: 2021-04-16 | Disposition: A | Discharge status: Home / Self Care | Payer: BC Managed Care – PPO | Attending: Emergency Medicine | Admitting: Emergency Medicine

## 2021-04-16 ENCOUNTER — Emergency Department: Payer: BC Managed Care – PPO

## 2021-04-16 ENCOUNTER — Encounter: Payer: Self-pay | Admitting: Emergency Medicine

## 2021-04-16 ENCOUNTER — Other Ambulatory Visit: Payer: Self-pay

## 2021-04-16 DIAGNOSIS — R410 Disorientation, unspecified: Secondary | ICD-10-CM

## 2021-04-16 DIAGNOSIS — E871 Hypo-osmolality and hyponatremia: Secondary | ICD-10-CM | POA: Diagnosis not present

## 2021-04-16 DIAGNOSIS — R0602 Shortness of breath: Secondary | ICD-10-CM | POA: Diagnosis not present

## 2021-04-16 DIAGNOSIS — R4182 Altered mental status, unspecified: Secondary | ICD-10-CM | POA: Diagnosis not present

## 2021-04-16 DIAGNOSIS — R079 Chest pain, unspecified: Secondary | ICD-10-CM | POA: Diagnosis not present

## 2021-04-16 LAB — BASIC METABOLIC PANEL
Anion gap: 6 (ref 5–15)
BUN: 10 mg/dL (ref 6–20)
CO2: 24 mmol/L (ref 22–32)
Calcium: 9.3 mg/dL (ref 8.9–10.3)
Chloride: 104 mmol/L (ref 98–111)
Creatinine, Ser: 0.66 mg/dL (ref 0.61–1.24)
GFR, Estimated: >60 mL/min (ref >60)
Glucose, Bld: 150 mg/dL — ABNORMAL HIGH (ref 70–99)
Potassium: 4 mmol/L (ref 3.5–5.1)
Sodium: 134 mmol/L — ABNORMAL LOW (ref 135–145)

## 2021-04-16 LAB — CBC WITH DIFFERENTIAL/PLATELET
Abs Immature Granulocytes: 0.02 10*3/uL (ref 0.00–0.07)
Basophils Absolute: 0.1 10*3/uL (ref 0.0–0.1)
Basophils Relative: 1 %
Eosinophils Absolute: 0.2 10*3/uL (ref 0.0–0.5)
Eosinophils Relative: 2 %
HCT: 47.3 % (ref 39.0–52.0)
Hemoglobin: 15.9 g/dL (ref 13.0–17.0)
Immature Granulocytes: 0 %
Lymphocytes Relative: 28 %
Lymphs Abs: 2.6 10*3/uL (ref 0.7–4.0)
MCH: 30.1 pg (ref 26.0–34.0)
MCHC: 33.6 g/dL (ref 30.0–36.0)
MCV: 89.4 fL (ref 80.0–100.0)
Monocytes Absolute: 0.7 10*3/uL (ref 0.1–1.0)
Monocytes Relative: 8 %
Neutro Abs: 5.7 10*3/uL (ref 1.7–7.7)
Neutrophils Relative %: 61 %
Platelets: 247 10*3/uL (ref 150–400)
RBC: 5.29 MIL/uL (ref 4.22–5.81)
RDW: 12.6 % (ref 11.5–15.5)
WBC: 9.3 10*3/uL (ref 4.0–10.5)
nRBC: 0 % (ref 0.0–0.2)

## 2021-04-16 LAB — TROPONIN I (HIGH SENSITIVITY)
Troponin I (High Sensitivity): 4 ng/L (ref <18)
Troponin I (High Sensitivity): 5 ng/L (ref <18)

## 2021-04-16 NOTE — Discharge Instructions (Signed)
Your labwork and head CT were normal today. Please follow up with neurology to continue the evaluation.

## 2021-04-16 NOTE — ED Triage Notes (Signed)
First Nurse Note:  Arrives from [facility] for ED evaluation of confusion x 1 week.  Patient is AAOx3.  Skin warm and dry. NAD

## 2021-04-16 NOTE — ED Provider Notes (Signed)
[facility] Provider Note    Event Date/Time   First MD Initiated Contact with Patient 04/16/21 1631     (approximate)   History   Altered mental status   HPI  Zayn Selley III is a 51 y.o. male who reports over the last month has had lapses in memory which seems to have gotten somewhat worse over the last couple of weeks.  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  He reports this is intermittent.  He denies headache.  No neurodeficits.  No fevers or chills.  He reports his mother has dementia     Physical Exam   Triage Vital Signs: ED Triage Vitals  Enc Vitals Group     BP 04/16/21 1349 (!) 143/105     Pulse Rate 04/16/21 1349 86     Resp 04/16/21 1349 16     Temp 04/16/21 1349 98.4 F (36.9 C)     Temp Source 04/16/21 1349 Oral     SpO2 04/16/21 1349 96 %     Weight 04/16/21 1350 85.7 kg (189 lb)     Height 04/16/21 1350 1.778 m (5\' 10" )     Head Circumference --      Peak Flow --      Pain Score 04/16/21 1350 0     Pain Loc --      Pain Edu? --      Excl. in GC? --     Most recent vital signs: Vitals:   04/16/21 1700 04/16/21 1806  BP: (!) 139/92 139/90  Pulse: 77 80  Resp: 18 18  Temp:    SpO2: 97% 96%     General: Awake, no distress.  CV:  Good peripheral perfusion.  Resp:  Normal effort.  Abd:   Other:  Normal neuro exam   ED Results / Procedures / Treatments   Labs (all labs ordered are listed, but only abnormal results are displayed) Labs Reviewed  BASIC METABOLIC PANEL - Abnormal; Notable for the following components:      Result Value   Sodium 134 (*)    Glucose, Bld 150 (*)    All other components within normal limits  CBC WITH DIFFERENTIAL/PLATELET  TROPONIN I (HIGH SENSITIVITY)  TROPONIN I (HIGH SENSITIVITY)     EKG  ED ECG REPORT I, 06/14/21, the attending physician, personally viewed and interpreted this ECG.  Date:  04/16/2021  Rhythm: normal sinus rhythm QRS Axis: normal Intervals: normal ST/T Wave abnormalities: normal Narrative Interpretation: no evidence of acute ischemia    RADIOLOGY Chest x-ray reviewed by me, no acute abnormality    PROCEDURES:  Critical Care performed:   Procedures   MEDICATIONS ORDERED IN ED: Medications - No data to display   IMPRESSION / MDM / ASSESSMENT AND PLAN / ED COURSE  I reviewed the triage vital signs and the nursing notes.    Patient presents with memory issues seems have worsened over the last month but notes that has been occurring prior to that as well.  Lab work today is unremarkable, normal white blood cell count, cardiac enzymes drawn given an episode of chest discomfort he had earlier in the week, none today.  Mild hyponatremia.  Will send for CT head  Differential includes CVA, early onset dementia, other neuromuscular disorders  Will require outpatient follow-up with neurology for further evaluation.        FINAL CLINICAL IMPRESSION(S) /  ED DIAGNOSES   Final diagnoses:  Confusion     Rx / DC Orders   ED Discharge Orders     None        Note:  This document was prepared using Dragon voice recognition software and may include unintentional dictation errors.   Jene Every, MD 04/16/21 [phone]

## 2021-04-16 NOTE — ED Triage Notes (Signed)
Pt to ED via [facility] for Confusion, chest pain, and shortness of breath x 1 month. Pt states that he has been getting lost a lot lately. Pt states that he will be heading somewhere but will end up back at his work or at home. Pt states that he went to pick his wife's car up lost night and was not able to drive back home because he wasn't sure where he was and did not feel safe driving. Pt reports that this is getting worse over the last few weeks.   Pt states that the chest pain and shortness of breath has been going on for a few weeks, Pt states that these symptoms start when he starts to feel anxious about forgetting things. Pt denies having chest pain currently. Pt states that he went to work today and his boss told him that he needed to come get checked. Pt is in NAD.

## 2021-06-12 DIAGNOSIS — Z0000 Encounter for general adult medical examination without abnormal findings: Secondary | ICD-10-CM | POA: Diagnosis not present

## 2021-06-12 DIAGNOSIS — Z79899 Other long term (current) drug therapy: Secondary | ICD-10-CM | POA: Diagnosis not present

## 2021-06-12 DIAGNOSIS — Z13220 Encounter for screening for lipoid disorders: Secondary | ICD-10-CM | POA: Diagnosis not present

## 2021-06-12 DIAGNOSIS — Z1331 Encounter for screening for depression: Secondary | ICD-10-CM | POA: Diagnosis not present

## 2021-06-12 DIAGNOSIS — F331 Major depressive disorder, recurrent, moderate: Secondary | ICD-10-CM | POA: Diagnosis not present

## 2021-09-01 DIAGNOSIS — F3170 Bipolar disorder, currently in remission, most recent episode unspecified: Secondary | ICD-10-CM | POA: Diagnosis not present

## 2021-11-03 ENCOUNTER — Emergency Department
Admission: EM | Admit: 2021-11-03 | Discharge: 2021-11-03 | Disposition: A | Discharge status: Home / Self Care | Payer: BC Managed Care – PPO | Attending: Emergency Medicine | Admitting: Emergency Medicine

## 2021-11-03 ENCOUNTER — Other Ambulatory Visit: Payer: Self-pay

## 2021-11-03 ENCOUNTER — Emergency Department: Payer: BC Managed Care – PPO

## 2021-11-03 ENCOUNTER — Encounter: Payer: Self-pay | Admitting: Intensive Care

## 2021-11-03 DIAGNOSIS — R4182 Altered mental status, unspecified: Secondary | ICD-10-CM | POA: Diagnosis not present

## 2021-11-03 DIAGNOSIS — R9431 Abnormal electrocardiogram [ECG] [EKG]: Secondary | ICD-10-CM | POA: Diagnosis not present

## 2021-11-03 DIAGNOSIS — F319 Bipolar disorder, unspecified: Secondary | ICD-10-CM | POA: Diagnosis not present

## 2021-11-03 DIAGNOSIS — F329 Major depressive disorder, single episode, unspecified: Secondary | ICD-10-CM | POA: Insufficient documentation

## 2021-11-03 DIAGNOSIS — F32A Depression, unspecified: Secondary | ICD-10-CM | POA: Diagnosis not present

## 2021-11-03 DIAGNOSIS — R45851 Suicidal ideations: Secondary | ICD-10-CM | POA: Diagnosis not present

## 2021-11-03 DIAGNOSIS — F1014 Alcohol abuse with alcohol-induced mood disorder: Secondary | ICD-10-CM | POA: Insufficient documentation

## 2021-11-03 HISTORY — DX: Bipolar disorder, unspecified: F31.9

## 2021-11-03 LAB — URINALYSIS, ROUTINE W REFLEX MICROSCOPIC
Bacteria, UA: NONE SEEN
Bilirubin Urine: NEGATIVE
Glucose, UA: 50 mg/dL — AB
Hgb urine dipstick: NEGATIVE
Ketones, ur: 5 mg/dL — AB
Leukocytes,Ua: NEGATIVE
Nitrite: NEGATIVE
Protein, ur: 30 mg/dL — AB
Specific Gravity, Urine: 1.019 (ref 1.005–1.030)
Squamous Epithelial / HPF: NONE SEEN (ref 0–5)
pH: 6 (ref 5.0–8.0)

## 2021-11-03 LAB — CBC
HCT: 47.8 % (ref 39.0–52.0)
Hemoglobin: 16 g/dL (ref 13.0–17.0)
MCH: 29.3 pg (ref 26.0–34.0)
MCHC: 33.5 g/dL (ref 30.0–36.0)
MCV: 87.5 fL (ref 80.0–100.0)
Platelets: 263 10*3/uL (ref 150–400)
RBC: 5.46 MIL/uL (ref 4.22–5.81)
RDW: 13.1 % (ref 11.5–15.5)
WBC: 8.8 10*3/uL (ref 4.0–10.5)
nRBC: 0 % (ref 0.0–0.2)

## 2021-11-03 LAB — URINE DRUG SCREEN, QUALITATIVE (ARMC ONLY)
Amphetamines, Ur Screen: NOT DETECTED
Barbiturates, Ur Screen: NOT DETECTED
Benzodiazepine, Ur Scrn: NOT DETECTED
Cannabinoid 50 Ng, Ur ~~LOC~~: POSITIVE — AB
Cocaine Metabolite,Ur ~~LOC~~: NOT DETECTED
MDMA (Ecstasy)Ur Screen: NOT DETECTED
Methadone Scn, Ur: NOT DETECTED
Opiate, Ur Screen: NOT DETECTED
Phencyclidine (PCP) Ur S: NOT DETECTED
Tricyclic, Ur Screen: NOT DETECTED

## 2021-11-03 LAB — COMPREHENSIVE METABOLIC PANEL
ALT: 21 U/L (ref 0–44)
AST: 18 U/L (ref 15–41)
Albumin: 4.5 g/dL (ref 3.5–5.0)
Alkaline Phosphatase: 69 U/L (ref 38–126)
Anion gap: 9 (ref 5–15)
BUN: 8 mg/dL (ref 6–20)
CO2: 22 mmol/L (ref 22–32)
Calcium: 9.8 mg/dL (ref 8.9–10.3)
Chloride: 106 mmol/L (ref 98–111)
Creatinine, Ser: 0.61 mg/dL (ref 0.61–1.24)
GFR, Estimated: >60 mL/min (ref >60)
Glucose, Bld: 160 mg/dL — ABNORMAL HIGH (ref 70–99)
Potassium: 3.8 mmol/L (ref 3.5–5.1)
Sodium: 137 mmol/L (ref 135–145)
Total Bilirubin: 0.9 mg/dL (ref 0.3–1.2)
Total Protein: 7.8 g/dL (ref 6.5–8.1)

## 2021-11-03 LAB — SALICYLATE LEVEL: Salicylate Lvl: <7 mg/dL — ABNORMAL LOW (ref 7.0–30.0)

## 2021-11-03 LAB — ETHANOL: Alcohol, Ethyl (B): <10 mg/dL (ref <10)

## 2021-11-03 LAB — ACETAMINOPHEN LEVEL: Acetaminophen (Tylenol), Serum: <10 ug/mL — ABNORMAL LOW (ref 10–30)

## 2021-11-03 NOTE — Consult Note (Signed)
[facility] Consult   Reason for Consult:  Depression Referring Physician:  Jacelyn Grip Patient Identification: Derrick Joyce MRN:  [id number] Principal Diagnosis: Bipolar disorder with depression ([place]) Diagnosis:  Principal Problem:   Bipolar disorder with depression ([place])   Total Time spent with patient: 45 minutes  Subjective:  "I'm having trouble where I can't think straight sometimes."  Derrick Joyce is a 51 y.o. male patient admitted with depression.  HPI:  Patient presented to the ED voluntarily with mood symptoms as well as some physical complaints (EDP aware)  On evaluation, patient is pleasant and cooperative. He is lucid, speaking in linear, coherent sentences. He describes having thoughts "that run into each other", some anxiety. States he has trouble thinking straight. He says he gets so frustrated that he gets angry and will yell at people and that makes him feel bad. Does not describes any manic-type episodes. He reports having these symptoms for approximately a year. His PCP has seen him and diagnosed with Bipolar Disorder, prescribed Cymbalta and Lamictal, but patient states he is not consistent with it because it takes it in the morning and it makes him tired. He admits that when he doesn't take the Lamictal his mood is "worse." Writer discussed that he can take it at night. He also has a referral to a neurologist and missed an appointment for a CT scan (EDP has ordered the CT here today, at patient request).  Patient describes passive thoughts of "maybe people would be better off without me." However, he denies having a thought or intent of any method of suicide. He denies current  thoughts of self-harm in any manner. Denies homicidal thoughts, paranoia, auditory or visual hallucinations.   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 He calls work his "safe  place." Says he feels better at work and not around other people. He has called a therapist that was made available to him at work, which he said help, but only talked to them one time. He is amenable to seeking consistent therapy and has insurance to cover it.     Attempted to get collateral from patient's wife, Levada Dy [phone]):  TTS, Renee, left HIPAA compliant message to return call. Patient to remain in ED pending collateral from patient's wife and medical clearance  Past Psychiatric History: Bipolar disorder. No suicide attempts and no hospitalizations  Risk to Self:   Risk to Others:   Prior Inpatient Therapy:   Prior Outpatient Therapy:    Past Medical History:  Past Medical History:  Diagnosis Date   Bipolar depression ([place])    Kidney stones     Past Surgical History:  Procedure Laterality Date   APPENDECTOMY     BACK SURGERY     HERNIA REPAIR     TYMPANOPLASTY     Family History: History reviewed. No pertinent family history. Family Psychiatric  History: mother, bipolar disorder Social History:  Social History   Substance and Sexual Activity  Alcohol Use No   Comment: occ     Social History   Substance and Sexual Activity  Drug Use Yes   Types: Marijuana    Social History   Socioeconomic History   Marital status: Married    Spouse name: Not on file   Number of children: Not on file   Years of education: Not on file   Highest education level: Not on file  Occupational History   Not on file  Tobacco Use   Smoking status: Never   Smokeless tobacco: Never  Vaping Use   Vaping Use: Never used  Substance and Sexual Activity   Alcohol use: No    Comment: occ   Drug use: Yes    Types: Marijuana   Sexual activity: Not on file  Other Topics Concern   Not on file  Social History Narrative   Not on file   Social Determinants of Health   Financial Resource Strain: Not on file  Food Insecurity: Not on file  Transportation Needs: Not on file   Physical Activity: Not on file  Stress: Not on file  Social Connections: Not on file   Additional Social History:    Allergies:  No Known Allergies  Labs:  Results for orders placed or performed during the hospital encounter of 11/03/21 (from the past 48 hour(s))  Comprehensive metabolic panel     Status: Abnormal   Collection Time: 11/03/21  2:05 PM  Result Value Ref Range   Sodium 137 135 - 145 mmol/L   Potassium 3.8 3.5 - 5.1 mmol/L   Chloride 106 98 - 111 mmol/L   CO2 22 22 - 32 mmol/L   Glucose, Bld 160 (H) 70 - 99 mg/dL    Comment: Glucose reference range applies only to samples taken after fasting for at least 8 hours.   BUN 8 6 - 20 mg/dL   Creatinine, Ser 0.61 0.61 - 1.24 mg/dL   Calcium 9.8 8.9 - 10.3 mg/dL   Total Protein 7.8 6.5 - 8.1 g/dL   Albumin 4.5 3.5 - 5.0 g/dL   AST 18 15 - 41 U/L   ALT 21 0 - 44 U/L   Alkaline Phosphatase 69 38 - 126 U/L   Total Bilirubin 0.9 0.3 - 1.2 mg/dL   GFR, Estimated >60 >60 mL/min    Comment: (NOTE) Calculated using the CKD-EPI Creatinine Equation (2021)    Anion gap 9 5 - 15    Comment: Performed at [facility], [address] [place], [place] [address]  Ethanol     Status: None   Collection Time: 11/03/21  2:05 PM  Result Value Ref Range   Alcohol, Ethyl (B) <10 <10 mg/dL    Comment: (NOTE) Lowest detectable limit for serum alcohol is 10 mg/dL.  For medical purposes only. Performed at [facility], [address] [place], [place] [id number]   Salicylate level     Status: Abnormal   Collection Time: 11/03/21  2:05 PM  Result Value Ref Range   Salicylate Lvl [id number] (L) 7.0 - 30.0 mg/dL    Comment: Performed at [facility], [address] [place], [place] [address]  Acetaminophen level     Status: Abnormal   Collection Time: 11/03/21  2:05 PM  Result Value Ref Range   Acetaminophen (Tylenol), Serum <10 (L) 10 - 30 ug/mL    Comment: (NOTE) Therapeutic concentrations vary  significantly. A range of 10-30 ug/mL  may be an effective concentration for many patients. However, some  are best treated at concentrations outside of this range. Acetaminophen concentrations >150 ug/mL at 4 hours after ingestion  and >50 ug/mL at 12 hours after ingestion are often associated with  toxic reactions.  Performed at [facility], [address] [place], [place] [address]   cbc     Status: None   Collection Time: 11/03/21  2:05 PM  Result Value Ref Range  WBC 8.8 4.0 - 10.5 K/uL   RBC 5.46 4.22 - 5.81 MIL/uL   Hemoglobin 16.0 13.0 - 17.0 g/dL   HCT 32.3 55.7 - 32.2 %   MCV 87.5 80.0 - 100.0 fL   MCH 29.3 26.0 - 34.0 pg   MCHC 33.5 30.0 - 36.0 g/dL   RDW 02.5 42.7 - 06.2 %   Platelets 263 150 - 400 K/uL   nRBC 0.0 0.0 - 0.2 %    Comment: Performed at [facility], [address] [place] [address] [place], [place] [address]    No current facility-administered medications for this encounter.   Current Outpatient Medications  Medication Sig Dispense Refill   DULoxetine (CYMBALTA) 60 MG capsule Take 120 mg by mouth daily.     lamoTRIgine (LAMICTAL) 100 MG tablet Take 100 mg by mouth daily.     cyclobenzaprine (FLEXERIL) 10 MG tablet Take 1 tablet (10 mg total) by mouth 3 (three) times daily as needed. (Patient not taking: Reported on 11/03/2021) 50 tablet 1   docusate sodium (COLACE) 100 MG capsule Take 1 capsule (100 mg total) by mouth 2 (two) times daily. (Patient not taking: Reported on 11/03/2021) 60 capsule 0   gabapentin (NEURONTIN) 300 MG capsule Take 300 mg by mouth 3 (three) times daily. (Patient not taking: Reported on 11/03/2021)  0   oxyCODONE-acetaminophen (PERCOCET) 10-325 MG tablet Take 1 tablet by mouth every 4 (four) hours as needed for pain. (Patient not taking: Reported on 11/03/2021) 100 tablet 0    Musculoskeletal: Strength & Muscle Tone: within normal limits Gait & Station: normal Patient leans: N/A  Psychiatric Specialty  Exam:  Presentation  General Appearance: Appropriate for Environment  Eye Contact:Good  Speech:Clear and Coherent  Speech Volume:Normal  Handedness:No data recorded  Mood and Affect  Mood:Depressed  Affect:Flat   Thought Process  Thought Processes:Coherent  Descriptions of Associations:Intact  Orientation:Full (Time, Place and Person)  Thought Content:Logical  History of Schizophrenia/Schizoaffective disorder:No data recorded Duration of Psychotic Symptoms:No data recorded Hallucinations:Hallucinations: None  Ideas of Reference:None  Suicidal Thoughts:Suicidal Thoughts: No  Homicidal Thoughts:Homicidal Thoughts: No   Sensorium  Memory:Immediate Good; Recent Good  Judgment:Good  Insight:Good   Executive Functions  Concentration:Good  Attention Span:Good  Recall:Good  Fund of Knowledge:Good  Language:Good   Psychomotor Activity  Psychomotor Activity:Psychomotor Activity: Normal   Assets  Assets:Communication Skills; Desire for Improvement; Financial Resources/Insurance; Housing; Intimacy; Physical Health; Resilience; Social Support   Sleep  Sleep:Sleep: Fair   Physical Exam: Physical Exam Vitals and nursing note reviewed.  HENT:     Head: Normocephalic.     Nose: No congestion or rhinorrhea.  Eyes:     General:        Right eye: No discharge.        Left eye: No discharge.  Cardiovascular:     Rate and Rhythm: Normal rate.  Pulmonary:     Effort: Pulmonary effort is normal.  Musculoskeletal:        General: Normal range of motion.     Cervical back: Normal range of motion.  Skin:    General: Skin is dry.  Neurological:     Mental Status: He is alert and oriented to person, place, and time.  Psychiatric:        Attention and Perception: Attention normal.        Mood and Affect: Affect is flat.        Speech: Speech normal.        Behavior: Behavior normal. Behavior is cooperative.  Thought Content: Thought content  normal. Thought content is not paranoid or delusional. Thought content does not include homicidal or suicidal ideation.        Cognition and Memory: Cognition normal.        Judgment: Judgment normal.    Review of Systems  Constitutional: Negative.   HENT: Negative.    Eyes: Negative.   Respiratory: Negative.    Cardiovascular: Negative.   Genitourinary: Negative.   Musculoskeletal: Negative.   Neurological: Negative.   Psychiatric/Behavioral:  Positive for depression. Negative for hallucinations, memory loss, substance abuse and suicidal ideas. The patient is not nervous/anxious and does not have insomnia.    Blood pressure (!) 161/111, pulse 91, temperature 98.2 F (36.8 C), temperature source Oral, resp. rate 18, height 5\' 10"  (1.778 m), weight 85.3 kg, SpO2 96 %. Body mass index is 26.98 kg/m.  Treatment Plan Summary: Plan: Patient will be observed and remain in the ED pending collateral from patient's wife and medical clearance. He may be psychiatrically discharged unless wife has concerns that would indicate need for psychiatric hospitalization. Patient needs to follow up with PCP and should consult with mental health provider/psychiatry as outpatient.  Reviewed with EDP  Disposition: Patient will be observed and remain in the ED pending collateral from patient's wife and medical clearance. He may be psychiatrically discharged unless wife has concerns that would indicate need for psychiatric hospitalization. He is undergoing medical evaluation at this time (1750).   , NP 11/03/2021 5:16 PM

## 2021-11-03 NOTE — Discharge Instructions (Signed)
Please follow-up psychiatry as discussed with you.  Please do not hesitate to return here if you need any further assistance.

## 2021-11-03 NOTE — ED Provider Notes (Signed)
[facility] Provider Note    Event Date/Time   First MD Initiated Contact with Patient 11/03/21 1427     (approximate)   History   Mental Health Problem   HPI  Derrick Joyce is a 51 y.o. male   Past medical history of bipolar, depression Presents with worsening depression and suicidal ideation.  He states that he is intermittently compliant with his medications because they make him feel tired.  He denies substance use, alcohol use, or self-harm today.  No ingestions.  No plan for suicide.  Homicidal ideation or audiovisual hallucinations.  He is here voluntarily seeking psychiatric help.  History was obtained via patient and review of external notes.      Physical Exam   Triage Vital Signs: ED Triage Vitals  Enc Vitals Group     BP 11/03/21 1405 (!) 161/111     Pulse Rate 11/03/21 1405 91     Resp 11/03/21 1405 18     Temp 11/03/21 1405 98.2 F (36.8 C)     Temp Source 11/03/21 1405 Oral     SpO2 11/03/21 1405 96 %     Weight 11/03/21 1406 210 lb (95.3 kg)     Height 11/03/21 1406 5\' 10"  (1.778 m)     Head Circumference --      Peak Flow --      Pain Score 11/03/21 1406 0     Pain Loc --      Pain Edu? --      Excl. in GC? --     Most recent vital signs: Vitals:   11/03/21 1405  BP: (!) 161/111  Pulse: 91  Resp: 18  Temp: 98.2 F (36.8 C)  SpO2: 96%    General: Awake, no distress.  CV:  Good peripheral perfusion.  Resp:  Normal effort.  Abd:  No distention.  Nontender Other:  Cooperative, moving all extremities, appropriate   ED Results / Procedures / Treatments   Labs (all labs ordered are listed, but only abnormal results are displayed) Labs Reviewed  COMPREHENSIVE METABOLIC PANEL - Abnormal; Notable for the following components:      Result Value   Glucose, Bld 160 (*)    All other components within normal limits  SALICYLATE LEVEL - Abnormal; Notable for the following components:   Salicylate Lvl <7.0 (*)     All other components within normal limits  ACETAMINOPHEN LEVEL - Abnormal; Notable for the following components:   Acetaminophen (Tylenol), Serum <10 (*)    All other components within normal limits  ETHANOL  CBC  URINE DRUG SCREEN, QUALITATIVE ([facility] ONLY)  URINALYSIS, ROUTINE W REFLEX MICROSCOPIC     I reviewed labs and they are notable for blood sugar of 160, no leukocytosis and a negative tox screen   RADIOLOGY I dependently reviewed and interpreted CT of the head which shows no overt bleed or midline shift   PROCEDURES:  Critical Care performed: No  Procedures   MEDICATIONS ORDERED IN ED: Medications - No data to display  IMPRESSION / MDM / ASSESSMENT AND PLAN / ED COURSE  I reviewed the triage vital signs and the nursing notes.                              Differential diagnosis includes, but is not limited to, depression and suicidal ideation, less likely organic causes given no signs or symptoms, no reports of trauma, no ingestions.  MDM: This patient with chiefly psychiatric complaints with psychiatric history and worsening chronically, noncompliant with medications, no other medical complaints.  We will do a medical screening including CT scan of the head which the patient states that he was ordered for outpatient, talk screen, basic labs and if cleared, will medically clear for psychiatric evaluation.  He has not involuntarily committed because he has no suicide attempt, no active plan, here voluntarily, and no evidence of acute psychosis and displays decision-making capacity on my evaluation.   Patient's presentation is most consistent with severe exacerbation of chronic illness.       FINAL CLINICAL IMPRESSION(S) / ED DIAGNOSES   Final diagnoses:  Depression with suicidal ideation     Rx / DC Orders   ED Discharge Orders     None        Note:  This document was prepared using Dragon voice recognition software and may include  unintentional dictation errors.    [name] Jarvis, MD 11/03/21 [phone]

## 2021-11-03 NOTE — BH Assessment (Signed)
Comprehensive Clinical Assessment (CCA) Screening, Triage and Referral Note  11/03/2021 Derrick Joyce [id number]  Derrick Joyce, 51 year old male who presents to [facility] ED voluntarily for treatment. Per triage note, Patient reports having panic attacks and cannot control his thought or lashing out at people. Recently diagnosed with bipolar depression. Reports thoughts of wanting to hurt himself. Reports some thoughts tell him "You are no good." Denies plan to hurt himself, just thinks about it. Urinary symptoms of needing to urinate and little results. Denies N/V/D. Patient also had CT scan of head ordered by PCP that he missed the appointment and patient, and family would like the scan done while here.    During TTS assessment pt presents alert and oriented x 4, anxious but cooperative, and mood-congruent with affect. The pt does not appear to be responding to internal or external stimuli. Neither is the pt presenting with any delusional thinking. Pt verified the information provided to triage RN.   Pt identifies his main complaint to be that he has periods of where he cannot think straight. Patient reports those times become frustrating and he takes it out on his wife. "I am not myself and I say mean things." Patient states things have worsened over the past couple of weeks, and he has been having thoughts of wanting to harm himself. Patient states that although this is new, he does not want to die, nor does he have a plan. "I have enough common sense not to do anything." Patient sees his PCP and is prescribed medication. Patient reports he sometimes forgets to take his medicine and has noticed that is when he begins to feel very anxious. 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 Patient states his wife could tell that he was starting to show signs of anxiety. Patient does not believe hospitalization is necessary now. Pt reports no  prior INPT hx. Pt reports family hx of MH from his mother. "She was bipolar." Pt denies current SI/HI/AH/VH. Pt contracts for safety.    Pt provided his wife, [name] as a collateral contact. Psych Team reached out and left message on voicemail to return call.    Per Sallye Ober, NP, patient will be observed and remain in the ED pending collateral from patient's wife and medical clearance. He may be psychiatrically discharged unless wife has concerns that would indicate need for psychiatric hospitalization.   Chief Complaint:  Chief Complaint  Patient presents with   Mental Health Problem   Visit Diagnosis: Bipolar disorder with depression  Patient Reported Information How did you hear about [place]? Self  What Is the Reason for Your Visit/Call Today? Patient reports he is not able to think straight and becomes frustrated.  How Long Has This Been Causing You Problems? 1-6 months  What Do You Feel Would Help You the Most Today? -- (Assessment only)   Have You Recently Had Any Thoughts About Hurting Yourself? Yes  Are You Planning to Commit Suicide/Harm Yourself At This time? No   Have you Recently Had Thoughts About Hurting Someone Karolee Ohs? No  Are You Planning to Harm Someone at This Time? No  Explanation: No data recorded  Have You Used Any Alcohol or Drugs in the Past 24 Hours? No  How Long Ago Did You Use Drugs or Alcohol? No data recorded What Did You Use and How Much? No data recorded  Do You Currently Have a Therapist/Psychiatrist? No  Name  of Therapist/Psychiatrist: No data recorded  Have You Been Recently Discharged From Any Office Practice or Programs? No  Explanation of Discharge From Practice/Program: No data recorded   CCA Screening Triage Referral Assessment Type of Contact: Face-to-Face  Telemedicine Service Delivery:   Is this Initial or Reassessment? No data recorded Date Telepsych consult ordered in [facility]:  No data recorded Time Telepsych consult ordered in [facility]:   No data recorded Location of Assessment: No data recorded Provider Location: [facility] ED   Collateral Involvement: None provided   Does Patient Have a Court Appointed Legal Guardian? No data recorded Name and Contact of Legal Guardian: No data recorded If Minor and Not Living with Parent(s), Who has Custody? n/a  Is CPS involved or ever been involved? Never  Is APS involved or ever been involved? Never   Patient Determined To Be At Risk for Harm To Self or Others Based on Review of Patient Reported Information or Presenting Complaint? No  Method: No data recorded Availability of Means: No data recorded Intent: No data recorded Notification Required: No data recorded Additional Information for Danger to Others Potential: No data recorded Additional Comments for Danger to Others Potential: No data recorded Are There Guns or Other Weapons in Your Home? No data recorded Types of Guns/Weapons: No data recorded Are These Weapons Safely Secured?                            No data recorded Who Could Verify You Are Able To Have These Secured: No data recorded Do You Have any Outstanding Charges, Pending Court Dates, Parole/Probation? No data recorded Contacted To Inform of Risk of Harm To Self or Others: No data recorded  Does Patient Present under Involuntary Commitment? No  IVC Papers Initial File Date: No data recorded  [place] of Residence: [place]   Patient Currently Receiving the Following Services: Medication Management   Determination of Need: Emergent (2 hours)   Options For Referral: ED Visit; Medication Management; Outpatient Therapy   Discharge Disposition:     Clerance Lav, Counselor, LCAS-A

## 2021-11-03 NOTE — ED Notes (Signed)
Pt returned from CT °

## 2021-11-03 NOTE — ED Notes (Addendum)
Pt to CT

## 2021-11-03 NOTE — BH Assessment (Signed)
This Clinical research associate spoke with Derrick Joyce, Trimm (Spouse) [phone] for collateral. Derrick Joyce reports that the pt makes passive SI statements and is confused when having "episodes". Derrick Joyce explained that the pt has been dx with bipolar depression. Derrick Joyce reported that she does not have any safety concerns for the pt. Derrick Joyce requested outpatient resources for the pt to follow up with.

## 2021-11-03 NOTE — BH Assessment (Signed)
Pt provided with outpatient resources and crisis line information by this Clinical research associate.

## 2021-11-03 NOTE — ED Triage Notes (Addendum)
Patient reports having panic attacks and cannot control his thought or lashing out at people. Recently diagnosed with bipolar depression. Reports thoughts of wanting to hurt himself. Reports some thoughts tell him "you are no good."   Denies plan to hurt himself, just thinks about it  Urinary symptoms of needing to urinate and little results. Denies N/V/D  Patient also had CT scan of head ordered by PCP that he missed the appointment and patient and family would like the scan done while here

## 2021-11-03 NOTE — ED Notes (Signed)
Behavioral health provider at the bedside for pt evaluation

## 2021-11-03 NOTE — ED Provider Notes (Addendum)
EKG read interpreted by me shows normal sinus rhythm rate of 77 extreme rightward axis flipped T's in 1 2 and L as well as aVF.  Comparing to previous EKGs these may be that there is limb lead reversal here.  We will repeat it again a little bit.   Arnaldo Natal, MD 11/03/21 1727 ----------------------------------------- 7:57 PM on 11/03/2021 ----------------------------------------- EKG #2 read interpreted by me shows normal sinus rhythm rate of 70 left axis this EKG looks very similar to previous ones.  Patient has been seen and cleared by psych.  Spoke with the patient's wife.  Plan is to discharge him shortly with outpatient resources return as needed.   Arnaldo Natal, MD 11/03/21 1958

## 2021-11-03 NOTE — ED Notes (Signed)
VOL  PENDING  PLACEMENT 

## 2021-11-03 NOTE — ED Provider Triage Note (Signed)
Emergency Medicine Provider Triage Evaluation Note  Derrick Joyce , a 51 y.o. male  was evaluated in triage.  Pt complains of panic attacks.  States that he feels like hurting himself. No plan just thinking about it.   Recent Dx of depression and on medication at [facility].    Review of Systems  Positive: Urinary sx.  + hx of kidney stones Negative: No N/V  Physical Exam  There were no vitals taken for this visit. Gen:   Awake, no distress   Resp:  Normal effort  Lungs Clear.   MSK:   Moves extremities without difficulty  Other:    Medical Decision Making  Medically screening exam initiated at 2:03 PM.  Appropriate orders placed.  Derrick Joyce was informed that the remainder of the evaluation will be completed by another provider, this initial triage assessment does not replace that evaluation, and the importance of remaining in the ED until their evaluation is complete.     Derrick Rumps, PA-C 11/03/21 1407

## 2021-11-03 NOTE — ED Notes (Signed)
Hospital dinner tray and beverage provided. Pt resting with eyes closed and even respirations. Pt easily awoken and encouraged to sit up and eat. Verbalized understanding.

## 2021-11-03 NOTE — ED Notes (Signed)
Patient currently wearing glasses

## 2021-11-06 DIAGNOSIS — Z79899 Other long term (current) drug therapy: Secondary | ICD-10-CM | POA: Diagnosis not present

## 2021-11-06 DIAGNOSIS — E119 Type 2 diabetes mellitus without complications: Secondary | ICD-10-CM | POA: Diagnosis not present

## 2021-11-06 DIAGNOSIS — F3170 Bipolar disorder, currently in remission, most recent episode unspecified: Secondary | ICD-10-CM | POA: Diagnosis not present

## 2021-11-06 LAB — COMPREHENSIVE METABOLIC PANEL
Albumin: 4.6 (ref 3.5–5.0)
Calcium: 9.7 (ref 8.7–10.7)

## 2021-11-06 LAB — BASIC METABOLIC PANEL
BUN: 10 (ref 4–21)
CO2: 27 — AB (ref 13–22)
Chloride: 103 (ref 99–108)
Creatinine: 0.8 (ref 0.6–1.3)
Glucose: 119
Potassium: 4.1 mEq/L (ref 3.5–5.1)
Sodium: 137 (ref 137–147)

## 2021-11-06 LAB — CBC AND DIFFERENTIAL: Platelets: 250 10*3/uL (ref 150–400)

## 2021-11-06 LAB — TSH
TSH: 3.45 (ref 0.41–5.90)
TSH: 4.7 (ref 0.41–5.90)

## 2021-11-06 LAB — LIPID PANEL
Cholesterol: 267 — AB (ref 0–200)
HDL: 41 (ref 35–70)
LDL Cholesterol: 181
Triglycerides: 224 — AB (ref 40–160)

## 2021-11-06 LAB — HEPATIC FUNCTION PANEL
ALT: 17 U/L (ref 10–40)
AST: 12 — AB (ref 14–40)
Alkaline Phosphatase: 73 (ref 25–125)
Bilirubin, Total: 0.5

## 2021-11-06 LAB — PROTEIN / CREATININE RATIO, URINE
Albumin, U: 24
Creatinine, Urine: 103.2

## 2021-11-06 LAB — MICROALBUMIN / CREATININE URINE RATIO: Microalb Creat Ratio: 23.3

## 2022-03-14 DIAGNOSIS — E785 Hyperlipidemia, unspecified: Secondary | ICD-10-CM | POA: Diagnosis not present

## 2022-03-14 DIAGNOSIS — E669 Obesity, unspecified: Secondary | ICD-10-CM | POA: Diagnosis not present

## 2022-03-14 DIAGNOSIS — I10 Essential (primary) hypertension: Secondary | ICD-10-CM | POA: Diagnosis not present

## 2022-03-14 DIAGNOSIS — K0889 Other specified disorders of teeth and supporting structures: Secondary | ICD-10-CM | POA: Diagnosis not present

## 2022-03-14 DIAGNOSIS — E119 Type 2 diabetes mellitus without complications: Secondary | ICD-10-CM | POA: Diagnosis not present

## 2022-03-14 DIAGNOSIS — Z79899 Other long term (current) drug therapy: Secondary | ICD-10-CM | POA: Diagnosis not present

## 2022-03-14 DIAGNOSIS — R221 Localized swelling, mass and lump, neck: Secondary | ICD-10-CM | POA: Diagnosis not present

## 2022-03-29 IMAGING — CT CT HEAD W/O CM
4 series · 17 of 47 positions shown, 19 images · non-contrast
Comparison: None.

CLINICAL DATA: Mental status change



[Series 2: head bone · axial · 0.44mm/px · z∈[-69,-13]mm · 4 of 79 slices shown]
[im 8/79  bone]
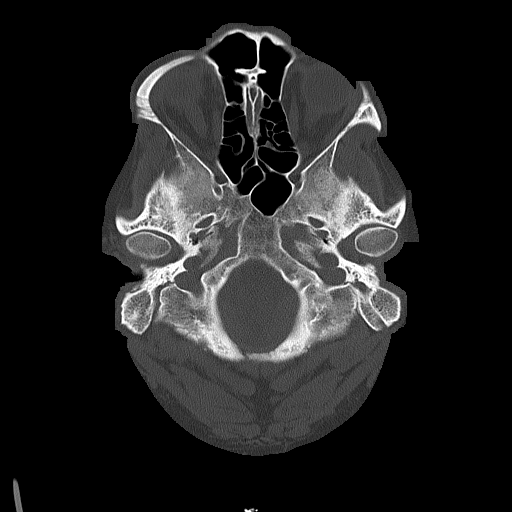
[im 16/79  bone]
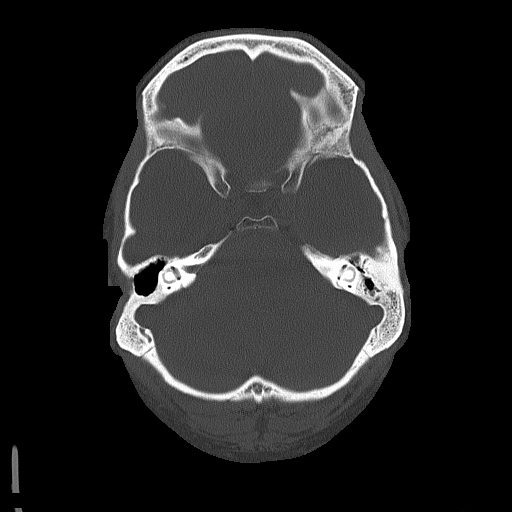
[im 24/79  bone]
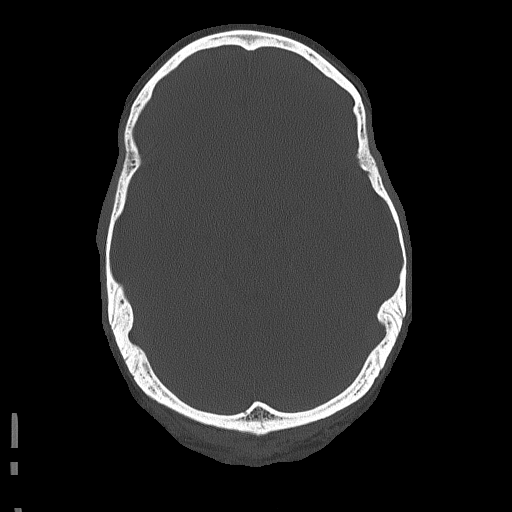
[im 36/79  bone]
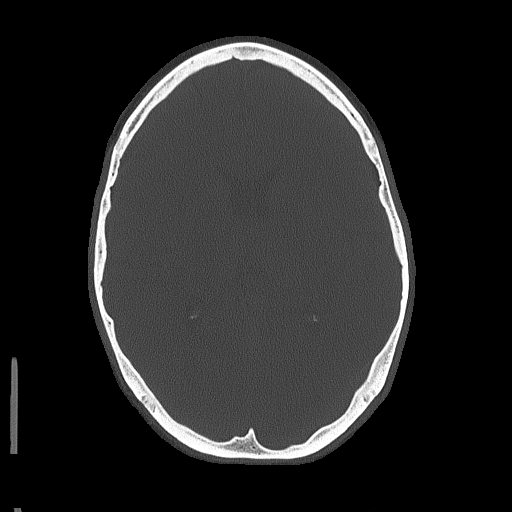

[Series 3: head wo · axial · 0.44mm/px · z∈[-68,+52]mm · 7 of 32 slices shown, 9 images]
[im 4/32  brain]
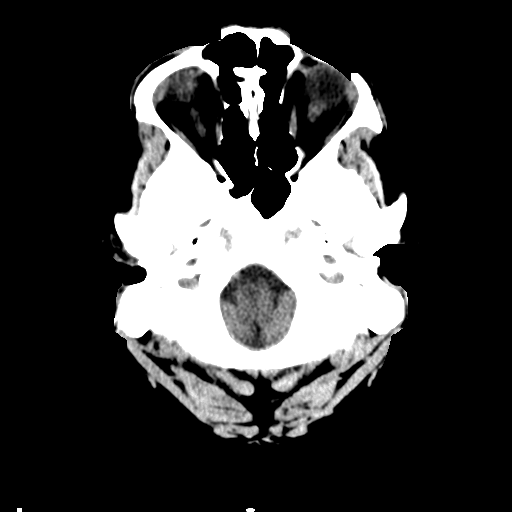
[im 4/32  bone]
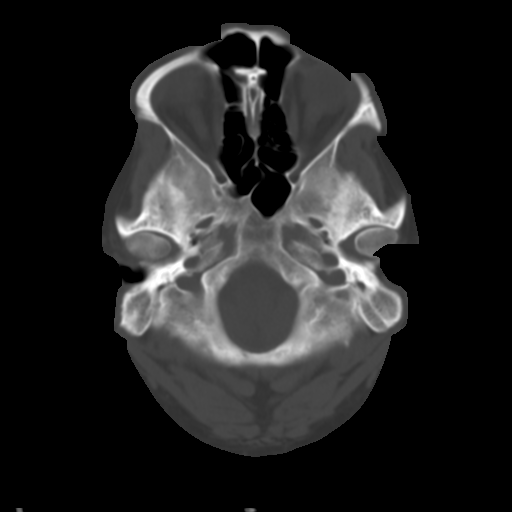
[im 8/32  brain]
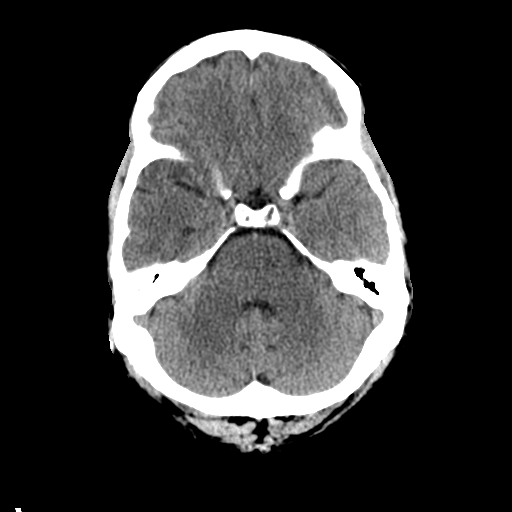
[im 12/32  brain]
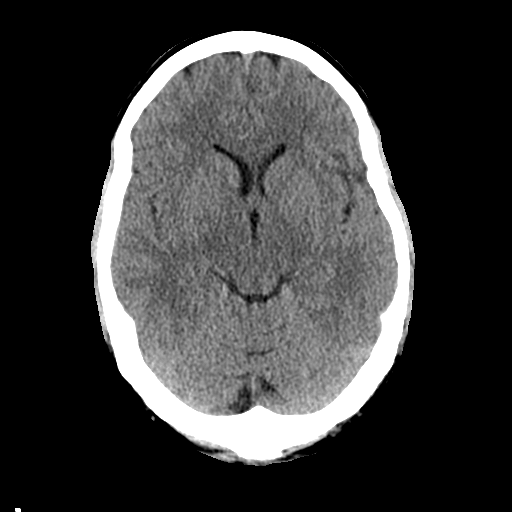
[im 16/32  brain]
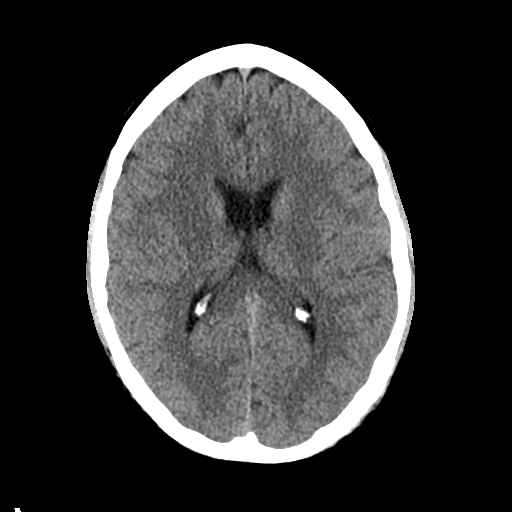
[im 20/32  brain]
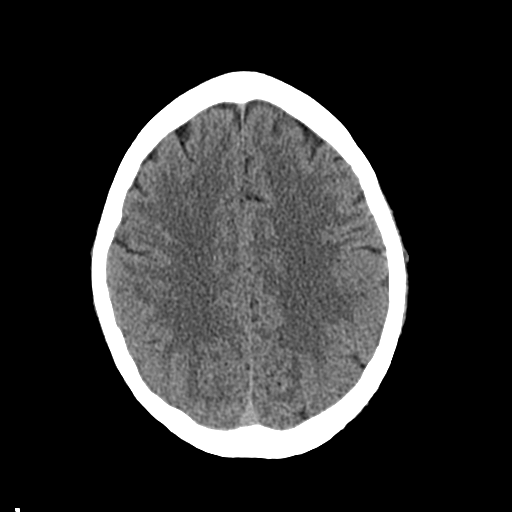
[im 20/32  bone]
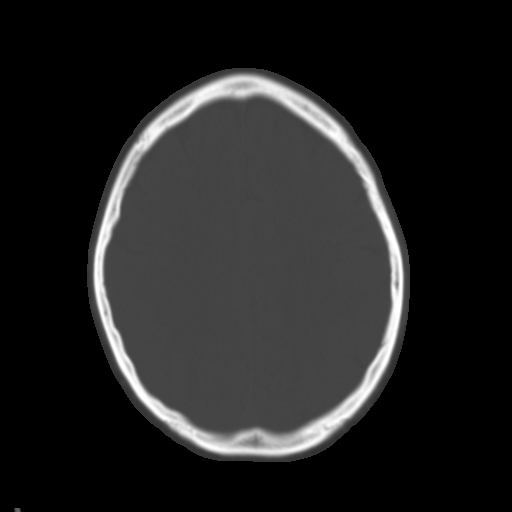
[im 24/32  brain]
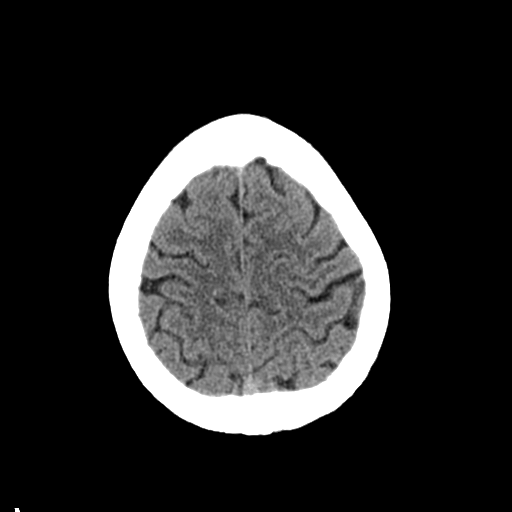
[im 28/32  brain]
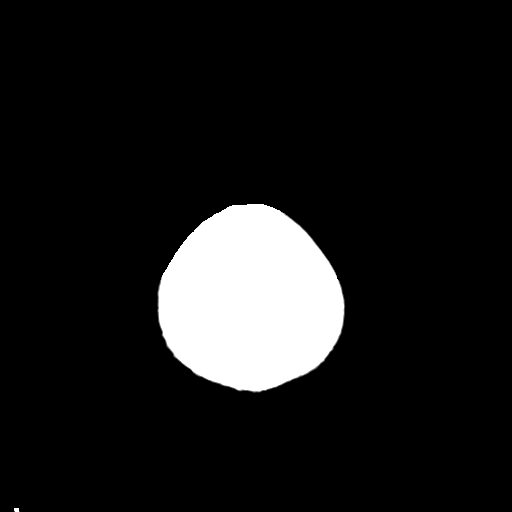

[Series 4: coronal soft tissue · coronal · 0.32mm/px · 3 of 66 slices shown]
[im 22/66  brain]
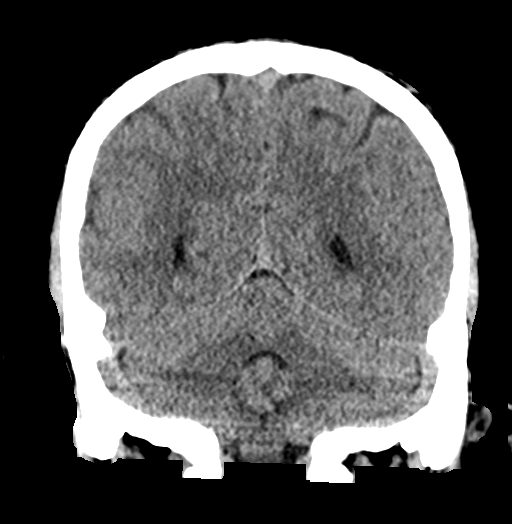
[im 29/66  brain]
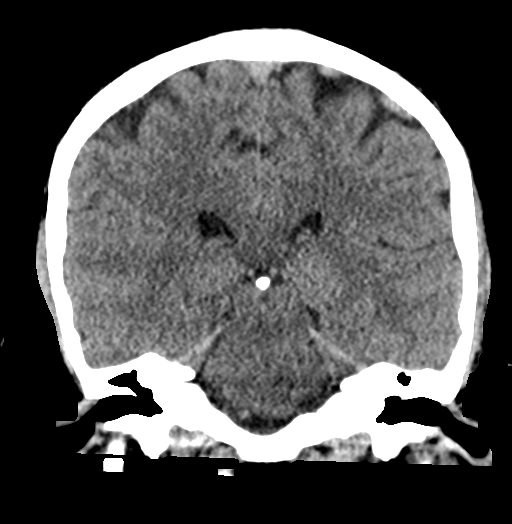
[im 37/66  brain]
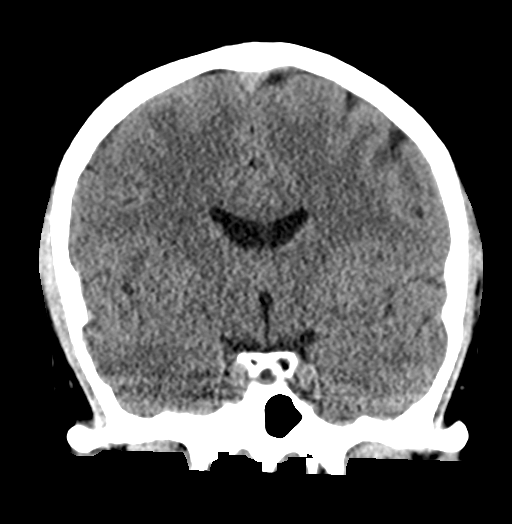

[Series 5: sagittal soft tissue · sagittal · 0.33mm/px · 3 of 56 slices shown]
[im 19/56  brain]
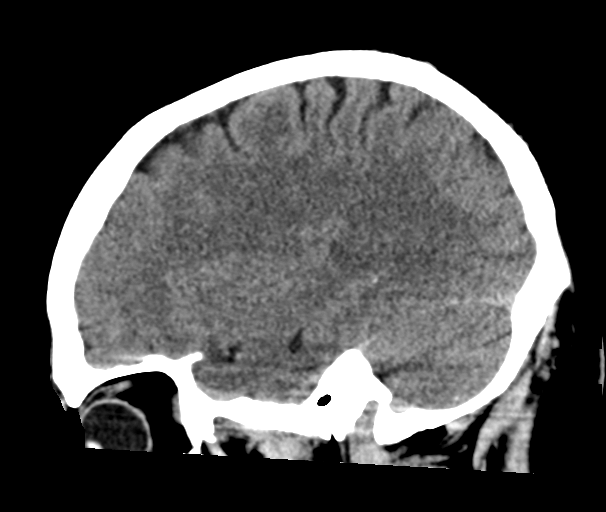
[im 28/56  brain]
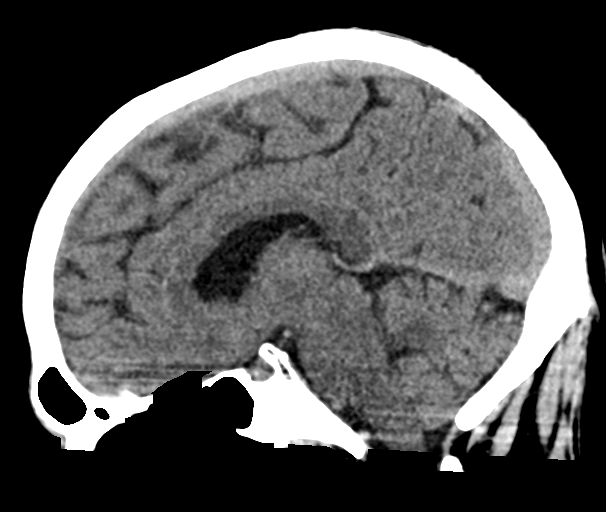
[im 37/56  brain]
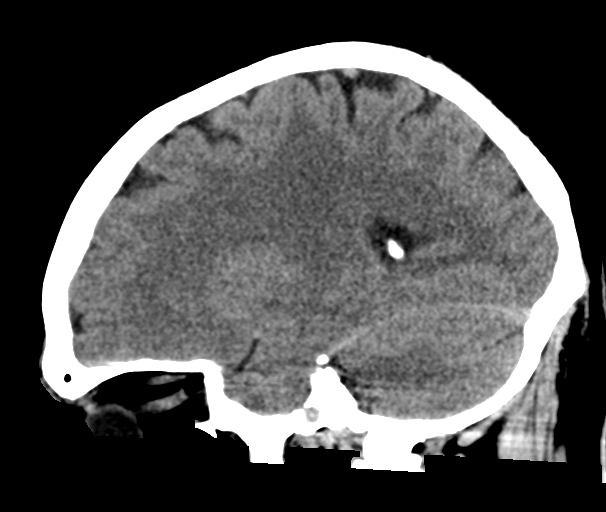

[17 of 47 positions shown; findings below may reference images not displayed]

FINDINGS: Brain: No evidence of acute infarction, hemorrhage, hydrocephalus,
extra-axial collection or mass lesion/mass effect.

Vascular: No hyperdense vessel or unexpected calcification.

Skull: Normal. Negative for fracture or focal lesion.

Sinuses/Orbits: No acute finding.

Other: None.
IMPRESSION: No acute intracranial abnormality.

## 2022-04-28 ENCOUNTER — Other Ambulatory Visit
Admission: RE | Admit: 2022-04-28 | Discharge: 2022-04-28 | Disposition: A | Discharge status: Home / Self Care | Payer: BC Managed Care – PPO | Source: Ambulatory Visit | Attending: Student | Admitting: Student

## 2022-04-28 DIAGNOSIS — Z03818 Encounter for observation for suspected exposure to other biological agents ruled out: Secondary | ICD-10-CM | POA: Diagnosis not present

## 2022-04-28 DIAGNOSIS — R112 Nausea with vomiting, unspecified: Secondary | ICD-10-CM | POA: Diagnosis not present

## 2022-04-28 DIAGNOSIS — E119 Type 2 diabetes mellitus without complications: Secondary | ICD-10-CM | POA: Diagnosis not present

## 2022-04-28 DIAGNOSIS — R5383 Other fatigue: Secondary | ICD-10-CM | POA: Diagnosis not present

## 2022-04-28 DIAGNOSIS — R079 Chest pain, unspecified: Secondary | ICD-10-CM | POA: Diagnosis not present

## 2022-04-28 DIAGNOSIS — R3915 Urgency of urination: Secondary | ICD-10-CM | POA: Diagnosis not present

## 2022-04-28 LAB — CBC AND DIFFERENTIAL
HCT: 43 (ref 41–53)
Hemoglobin: 15 (ref 13.5–17.5)
Platelets: 236 10*3/uL (ref 150–400)
WBC: 8.1

## 2022-04-28 LAB — BASIC METABOLIC PANEL
BUN: 10 (ref 4–21)
CO2: 29 — AB (ref 13–22)
Chloride: 103 (ref 99–108)
Creatinine: 0.6 (ref 0.6–1.3)
Glucose: 110
Potassium: 3.9 mEq/L (ref 3.5–5.1)
Sodium: 137 (ref 137–147)

## 2022-04-28 LAB — COMPREHENSIVE METABOLIC PANEL
Albumin: 4.4 (ref 3.5–5.0)
Calcium: 9.4 (ref 8.7–10.7)
eGFR: 118

## 2022-04-28 LAB — LIPID PANEL: LDl/HDL Ratio: 6.4

## 2022-04-28 LAB — HEPATIC FUNCTION PANEL
ALT: 21 U/L (ref 10–40)
AST: 16 (ref 14–40)
Alkaline Phosphatase: 75 (ref 25–125)
Bilirubin, Total: 0.4

## 2022-04-28 LAB — HEMOGLOBIN A1C: Hemoglobin A1C: 8.3

## 2022-04-28 LAB — CBC: RBC: 4.94 (ref 3.87–5.11)

## 2022-04-28 LAB — TROPONIN I (HIGH SENSITIVITY): Troponin I (High Sensitivity): 3 ng/L (ref <18)

## 2022-05-14 DIAGNOSIS — I209 Angina pectoris, unspecified: Secondary | ICD-10-CM | POA: Diagnosis not present

## 2022-05-14 DIAGNOSIS — E119 Type 2 diabetes mellitus without complications: Secondary | ICD-10-CM | POA: Diagnosis not present

## 2022-05-14 DIAGNOSIS — E785 Hyperlipidemia, unspecified: Secondary | ICD-10-CM | POA: Diagnosis not present

## 2022-05-14 DIAGNOSIS — R0789 Other chest pain: Secondary | ICD-10-CM | POA: Diagnosis not present

## 2022-05-18 ENCOUNTER — Encounter: Payer: Self-pay | Admitting: Physician Assistant

## 2022-05-18 DIAGNOSIS — E119 Type 2 diabetes mellitus without complications: Secondary | ICD-10-CM

## 2022-05-18 DIAGNOSIS — Z87442 Personal history of urinary calculi: Secondary | ICD-10-CM

## 2022-05-18 DIAGNOSIS — G8929 Other chronic pain: Secondary | ICD-10-CM | POA: Insufficient documentation

## 2022-05-18 DIAGNOSIS — M545 Low back pain, unspecified: Secondary | ICD-10-CM

## 2022-05-18 HISTORY — DX: Personal history of urinary calculi: Z87.442

## 2022-05-18 HISTORY — DX: Type 2 diabetes mellitus without complications: E11.9

## 2022-05-18 HISTORY — DX: Low back pain, unspecified: M54.50

## 2022-05-18 NOTE — Progress Notes (Unsigned)
    Date:  05/20/2022   Name:  Derrick Joyce   DOB:  [birth year]   MRN:  [id number]   Chief Complaint: No chief complaint on file.  Derrick Joyce is a pleasant 52 year old male with a history of bipolar disorder, HLD, and DM2 diagnosed in Aug 2023 with A1C 8.4%, repeat 2wk ago was 8.3%, question of compliance with metformin. Previously followed by Dr. Baldemar Lenis. Also had some atypical chest pain and fatigue seen by [facility] internal medicine 04/28/2022, referred to cardiology who he saw 05/14/2022 with plan for echo and Myoview 05/26/2022.  He has cardiology follow-up scheduled for 06/11/2022 with Dr. Gabriel Earing.    Lab Results  Component Value Date   NA 137 11/03/2021   K 3.8 11/03/2021   CO2 22 11/03/2021   GLUCOSE 160 (H) 11/03/2021   BUN 8 11/03/2021   CREATININE 0.61 11/03/2021   CALCIUM 9.8 11/03/2021   GFRNONAA >60 11/03/2021   No results found for: "CHOL", "HDL", "LDLCALC", "LDLDIRECT", "TRIG", "CHOLHDL" No results found for: "TSH" No results found for: "HGBA1C" Lab Results  Component Value Date   WBC 8.8 11/03/2021   HGB 16.0 11/03/2021   HCT 47.8 11/03/2021   MCV 87.5 11/03/2021   PLT 263 11/03/2021   Lab Results  Component Value Date   ALT 21 11/03/2021   AST 18 11/03/2021   ALKPHOS 69 11/03/2021   BILITOT 0.9 11/03/2021   No results found for: "25OHVITD2", "25OHVITD3", "VD25OH"   Review of Systems  Patient Active Problem List   Diagnosis Date Noted  . History of nephrolithiasis 05/18/2022  . Diabetes mellitus, type 2 ([place]) 05/18/2022  . Chronic lumbar pain 05/18/2022  . Bipolar disorder with depression ([place]) 11/03/2021  . Spondylolisthesis of lumbar region 08/05/2015    No Known Allergies  Past Surgical History:  Procedure Laterality Date  . APPENDECTOMY    . BACK SURGERY    . HERNIA REPAIR    . TYMPANOPLASTY      Social History   Tobacco Use  . Smoking status: Never  . Smokeless tobacco: Never  Vaping Use  . Vaping Use: Never used  Substance  Use Topics  . Alcohol use: No    Comment: occ  . Drug use: Yes    Types: Marijuana     Medication list has been reviewed and updated.  No outpatient medications have been marked as taking for the 05/20/22 encounter (Appointment) with Jeannene Patella, PA.        No data to display              No data to display          BP Readings from Last 3 Encounters:  11/03/21 (!) 154/94  04/16/21 139/90  08/07/15 116/75    Physical Exam  Wt Readings from Last 3 Encounters:  11/03/21 188 lb (85.3 kg)  04/16/21 189 lb (85.7 kg)  07/29/15 183 lb 4.8 oz (83.1 kg)    There were no vitals taken for this visit.  Assessment and Plan:

## 2022-05-20 ENCOUNTER — Ambulatory Visit: Payer: BC Managed Care – PPO | Admitting: Physician Assistant

## 2022-05-20 ENCOUNTER — Encounter: Payer: Self-pay | Admitting: Physician Assistant

## 2022-05-20 VITALS — BP 110/72 | HR 91 | Ht 70.0 in | Wt 187.0 lb

## 2022-05-20 DIAGNOSIS — F441 Dissociative fugue: Secondary | ICD-10-CM | POA: Diagnosis not present

## 2022-05-20 DIAGNOSIS — F319 Bipolar disorder, unspecified: Secondary | ICD-10-CM | POA: Diagnosis not present

## 2022-05-20 DIAGNOSIS — F1290 Cannabis use, unspecified, uncomplicated: Secondary | ICD-10-CM | POA: Insufficient documentation

## 2022-05-20 DIAGNOSIS — E119 Type 2 diabetes mellitus without complications: Secondary | ICD-10-CM

## 2022-05-20 DIAGNOSIS — N3941 Urge incontinence: Secondary | ICD-10-CM | POA: Diagnosis not present

## 2022-05-20 DIAGNOSIS — F411 Generalized anxiety disorder: Secondary | ICD-10-CM

## 2022-05-20 MED ORDER — HYDROXYZINE PAMOATE 25 MG PO CAPS: 25.0000 mg | ORAL_CAPSULE | Freq: Three times a day (TID) | ORAL | 2 refills | Status: AC | PRN

## 2022-05-20 NOTE — Patient Instructions (Signed)
-  wait for calls from behavioral health and urology. You may go to the walk-in clinic marked on your sheet today if needed -you may use the new medication up to 3x/day for anxiety -come back in about 3 months for follow-up

## 2022-05-20 NOTE — Assessment & Plan Note (Signed)
A1c 8.3% 3 weeks ago, no need to repeat today.  Routine labs also obtained recently. Encouraged good compliance with metformin and continued lifestyle changes to improve glycemic control.  Plan for repeat A1c at next routine visit

## 2022-05-20 NOTE — Assessment & Plan Note (Addendum)
Add hydroxyzine to current regimen, which patient may take up to 3 times per day, but may also use as needed.   May also find benefit from breathing techniques. Taught box breathing as nonpharmacologic option for reducing anxiety.

## 2022-05-20 NOTE — Assessment & Plan Note (Signed)
Urgent referral to behavioral health to address this problem, which I feel is probably psychiatric given his very high scores for PHQ-9 and GAD-7 today.  Prior diagnosis of bipolar disorder but not on mood stabilizing therapy at this time.  I will leave it to psych to address this.  Based on that consult, might consider referral to neurology as well

## 2022-05-20 NOTE — Assessment & Plan Note (Signed)
Possibly psychogenic.  Also possible that this is polyuria from poorly controlled diabetes, though I feel this is less likely. Will refer to urology for further workup on this.  Hydroxyzine for anxiety may or may not have added bonus of improving urinary urgency.

## 2022-05-20 NOTE — Assessment & Plan Note (Signed)
Advised patient chronic daily marijuana use can cause some cognitive changes especially if sleep is disturbed, though this may or may not be related to his current symptoms.  Patient seems convinced that they are unrelated given the timing, but would be willing to try marijuana cessation for several weeks to see if it improves symptoms.

## 2022-05-21 ENCOUNTER — Encounter: Payer: Self-pay | Admitting: Physician Assistant

## 2022-05-26 DIAGNOSIS — I2089 Other forms of angina pectoris: Secondary | ICD-10-CM | POA: Diagnosis not present

## 2022-05-26 DIAGNOSIS — R0789 Other chest pain: Secondary | ICD-10-CM | POA: Diagnosis not present

## 2022-06-11 DIAGNOSIS — R0789 Other chest pain: Secondary | ICD-10-CM | POA: Diagnosis not present

## 2022-06-11 DIAGNOSIS — E785 Hyperlipidemia, unspecified: Secondary | ICD-10-CM | POA: Diagnosis not present

## 2022-06-11 DIAGNOSIS — E119 Type 2 diabetes mellitus without complications: Secondary | ICD-10-CM | POA: Diagnosis not present

## 2022-07-31 ENCOUNTER — Ambulatory Visit: Payer: Self-pay | Admitting: Family Medicine

## 2022-08-13 ENCOUNTER — Ambulatory Visit: Admission: RE | Admit: 2022-08-13 | Payer: BC Managed Care – PPO | Source: Ambulatory Visit

## 2022-08-13 ENCOUNTER — Ambulatory Visit: Payer: BC Managed Care – PPO | Admitting: Physician Assistant

## 2022-08-13 ENCOUNTER — Encounter: Payer: Self-pay | Admitting: Physician Assistant

## 2022-08-13 ENCOUNTER — Ambulatory Visit: Payer: Self-pay | Admitting: *Deleted

## 2022-08-13 VITALS — BP 112/82 | HR 86 | Temp 98.0°F | Ht 70.0 in | Wt 177.0 lb

## 2022-08-13 DIAGNOSIS — R1011 Right upper quadrant pain: Secondary | ICD-10-CM

## 2022-08-13 DIAGNOSIS — R1111 Vomiting without nausea: Secondary | ICD-10-CM | POA: Diagnosis not present

## 2022-08-13 MED ORDER — ONDANSETRON HCL 8 MG PO TABS: 8.0000 mg | ORAL_TABLET | Freq: Three times a day (TID) | ORAL | 0 refills | Status: DC | PRN

## 2022-08-13 NOTE — Progress Notes (Signed)
Date:  08/13/2022   Name:  Derrick Joyce   DOB:  1971/02/01   MRN:  [id number]   Chief Complaint: Emesis  Emesis  This is a new problem. Episode onset: X1 day. The problem occurs less than 2 times per day. The problem has been resolved (last time was this morning at 10 AM). There has been no fever. Associated symptoms include diarrhea (generally loose stool at baseline). Pertinent negatives include no abdominal pain, chest pain or fever. Risk factors include suspect food intake (soy milk). Treatments tried: pepto. The treatment provided mild relief.   Derrick Joyce presents today for evaluation of acute onset vomiting since yesterday.  He vomited twice last night and once this morning around 10 AM, no vomiting since.  At the time of exam, he feels quite well and denies any symptoms of illness, eager to return to work but his wife made the appointment today because he needs a work excuse.  Interestingly, he says the vomiting is not associated with any sensation of nausea or dizziness, but rather it feels like the stomach is spasming "like I ate something bad".  He states he had a cookout on Sunday and ate a lot of sausages, unsure if this is related.  History of appendectomy but still has gallbladder.  He denies abdominal pain, bowel changes, nausea, fever, diaphoresis.  Unrelated to today's visit, he tells me he has an appointment to establish with behavioral health in June.  Urologist not yet scheduled as they were unable to reach him and unable to leave voicemail; I gave him the number for [facility] to make an appointment.  Medication list has been reviewed and updated.  Current Meds  Medication Sig   DULoxetine (CYMBALTA) 60 MG capsule Take 60 mg by mouth daily.   hydrOXYzine (VISTARIL) 25 MG capsule Take 1 capsule (25 mg total) by mouth every 8 (eight) hours as needed for anxiety (for acute anxiety).   IBUPROFEN PO Take by mouth daily.   metformin (FORTAMET) 500 MG  (OSM) 24 hr tablet Take 500 mg by mouth daily.   ondansetron (ZOFRAN) 8 MG tablet Take 1 tablet (8 mg total) by mouth every 8 (eight) hours as needed for nausea or vomiting.   rosuvastatin (CRESTOR) 40 MG tablet Take 40 mg by mouth daily.     Review of Systems  Constitutional:  Negative for fatigue and fever.  Respiratory:  Negative for chest tightness and shortness of breath.   Cardiovascular:  Negative for chest pain and palpitations.  Gastrointestinal:  Positive for diarrhea (generally loose stool at baseline) and vomiting. Negative for abdominal distention, abdominal pain, blood in stool and nausea.    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   Type 2 diabetes mellitus without complication, without long-term current use of insulin (HCC) 09/23/2016   Spondylolisthesis of lumbar region 08/05/2015    Allergies  Allergen Reactions   Gabapentin Other (See Comments)    Suicidal ideation   Cephalexin Rash    Immunization History  Administered Date(s) Administered   PFIZER(Purple Top)SARS-COV-2 Vaccination 06/01/2019, 06/27/2019    Past Surgical History:  Procedure Laterality Date  APPENDECTOMY     BACK SURGERY     HERNIA REPAIR     TYMPANOPLASTY      Social History   Tobacco Use   Smoking status: Never   Smokeless tobacco: Never  Vaping Use   Vaping Use: Never used  Substance Use Topics   Alcohol use: No   Drug use: Yes    Types: Marijuana    Family History  Problem Relation Age of Onset   Bipolar disorder Mother    Diabetes Father    Diabetes Paternal Grandmother    Cancer  Paternal Grandmother    Diabetes Paternal Grandfather    Cancer Paternal Grandfather    Heart attack Paternal Grandfather         08/13/2022    1:31 PM 05/20/2022    3:20 PM  GAD 7 : Generalized Anxiety Score  Nervous, Anxious, on Edge 3 3  Control/stop worrying 3 3  Worry too much - different things 3 3  Trouble relaxing 3 3  Restless 3 3  Easily annoyed or irritable 3 3  Afraid - awful might happen 3 3  Total GAD 7 Score 21 21  Anxiety Difficulty Somewhat difficult Extremely difficult       08/13/2022    1:31 PM 05/20/2022    3:20 PM  Depression screen PHQ 2/9  Decreased Interest 1 3  Down, Depressed, Hopeless 1 3  PHQ - 2 Score 2 6  Altered sleeping 3 3  Tired, decreased energy 3 3  Change in appetite 1 0  Feeling bad or failure about yourself  3 3  Trouble concentrating 3 3  Moving slowly or fidgety/restless 3 3  Suicidal thoughts 0 0  PHQ-9 Score 18 21  Difficult doing work/chores Somewhat difficult Extremely dIfficult    BP Readings from Last 3 Encounters:  08/13/22 112/82  05/20/22 110/72  11/03/21 (!) 154/94    Wt Readings from Last 3 Encounters:  08/13/22 177 lb (80.3 kg)  05/20/22 187 lb (84.8 kg)  11/03/21 188 lb (85.3 kg)    BP 112/82   Pulse 86   Temp 98 F (36.7 C) (Oral)   Ht 5\' 10"  (1.778 m)   Wt 177 lb (80.3 kg)   SpO2 98%   BMI 25.40 kg/m   Physical Exam Vitals and nursing note reviewed.  Constitutional:      Appearance: Normal appearance.  Cardiovascular:     Rate and Rhythm: Normal rate and regular rhythm.     Heart sounds: No murmur heard.    No friction rub. No gallop.  Pulmonary:     Effort: Pulmonary effort is normal.     Breath sounds: Normal breath sounds.  Abdominal:     General: Bowel sounds are normal. There is no distension.     Tenderness: There is abdominal tenderness in the right upper quadrant. There is guarding. There is no right CVA tenderness or left CVA tenderness. Positive signs include Murphy's sign.   Musculoskeletal:        General: Normal range of motion.  Skin:    General: Skin is warm and dry.  Neurological:     Mental Status: He is alert and oriented to person, place, and time.     Gait: Gait is intact.  Psychiatric:        Mood and Affect: Mood and affect normal.     Recent Labs     Component Value Date/Time   NA 137 04/28/2022 0000   NA 137 02/27/2014 1650  K 3.9 04/28/2022 0000   K 3.8 02/27/2014 1650   CL 103 04/28/2022 0000   CL 104 02/27/2014 1650   CO2 29 (A) 04/28/2022 0000   CO2 24 02/27/2014 1650   GLUCOSE 160 (H) 11/03/2021 1405   GLUCOSE 110 (H) 02/27/2014 1650   BUN 10 04/28/2022 0000   BUN 16 02/27/2014 1650   CREATININE 0.6 04/28/2022 0000   CREATININE 0.61 11/03/2021 1405   CREATININE 1.18 02/27/2014 1650   CALCIUM 9.4 04/28/2022 0000   CALCIUM 9.3 02/27/2014 1650   PROT 7.8 11/03/2021 1405   PROT 7.5 02/27/2014 1650   ALBUMIN 4.4 04/28/2022 0000   ALBUMIN 4.1 02/27/2014 1650   AST 16 04/28/2022 0000   AST 17 02/27/2014 1650   ALT 21 04/28/2022 0000   ALT 28 02/27/2014 1650   ALKPHOS 75 04/28/2022 0000   ALKPHOS 63 02/27/2014 1650   BILITOT 0.9 11/03/2021 1405   BILITOT 0.8 02/27/2014 1650   GFRNONAA >60 11/03/2021 1405   GFRNONAA >60 02/27/2014 1650   GFRNONAA >60 08/15/2011 0854   GFRAA >60 08/06/2015 0404   GFRAA >60 02/27/2014 1650   GFRAA >60 08/15/2011 0854    Lab Results  Component Value Date   WBC 8.1 04/28/2022   HGB 15.0 04/28/2022   HCT 43 04/28/2022   MCV 87.5 11/03/2021   PLT 236 04/28/2022   Lab Results  Component Value Date   HGBA1C 8.3 04/28/2022   Lab Results  Component Value Date   CHOL 267 (A) 11/06/2021   HDL 41 11/06/2021   LDLCALC 181 11/06/2021   TRIG 224 (A) 11/06/2021   Lab Results  Component Value Date   TSH 3.45 11/06/2021   TSH 4.70 11/06/2021     Assessment and Plan:  1. Vomiting without nausea, unspecified vomiting type Prescribing Zofran for as needed use.  Uncertain etiology at  this time, but concern for gallbladder pathology based on today's exam with RUQ tenderness and positive Murphy sign in the context of emesis.  No fever, patient is stable at this time.  Ordering stat/urgent RUQ ultrasound to further evaluate. - ondansetron (ZOFRAN) 8 MG tablet; Take 1 tablet (8 mg total) by mouth every 8 (eight) hours as needed for nausea or vomiting.  Dispense: 15 tablet; Refill: 0  2. RUQ pain Prescribing Zofran for as needed use.  Uncertain etiology at this time, but concern for gallbladder pathology based on today's exam with RUQ tenderness and positive Murphy sign in the context of emesis.  No fever, patient is stable at this time.  Ordering stat/urgent RUQ ultrasound to further evaluate. - US Abdomen Limited RUQ (LIVER/GB)   Return in about 2 weeks (around 08/27/2022) for OV f/u DM2, psych, uro, GB.   Partially dictated using Animal nutritionist. Any errors are unintentional.  Alvester Morin, PA-C, DMSc, Nutritionist [facility] Primary [facility] and Sports [facility] [phone]

## 2022-08-13 NOTE — Telephone Encounter (Signed)
  Chief Complaint: Vomited twice last night and once at 10:00 this morning.   None since then. Symptoms: For past 3 days have not felt well.   Has abd pain right before he vomits and right after then it goes away until he vomits again. Frequency: Vomited 3 times since last night.   Having loose stools. Pertinent Negatives: Patient denies fever or other symptoms other than just not feeling well Disposition: [] ED /[] Urgent Care (no appt availability in office) / [x] Appointment(In office/virtual)/ []  [place] [facility]/ [] Home Care/ [] Refused Recommended Disposition /[] [facility]/ []  Follow-up with PCP Additional Notes: Appt made for today with Tillie Fantasia, PA for 1:20.

## 2022-08-13 NOTE — Telephone Encounter (Signed)
Message from Derrick Joyce sent at 08/13/2022 12:28 PM EDT  Summary: vomiting   Pt stated has not been feeling well in the past 3 days. Stated has been vomiting twice last night and once today this morning. Mentioned abdominal pain but only before he vomits or after not at this moment.  Pt seeking clinical advice and an appointment.          Call History   Type Contact Phone/Fax User  08/13/2022 12:23 PM EDT Phone (Incoming) Hari, [place] III (Self) [phone] Judie Petit) McGill, Darlina Rumpf   Reason for Disposition  [1] MILD or MODERATE vomiting AND [2] present > 48 hours (2 days) (Exception: Mild vomiting with associated diarrhea.)  Answer Assessment - Initial Assessment Questions 1. VOMITING SEVERITY: "How many times have you vomited in the past 24 hours?"     - MILD:  1 - 2 times/day    - MODERATE: 3 - 5 times/day, decreased oral intake without significant weight loss or symptoms of dehydration    - SEVERE: 6 or more times/day, vomits everything or nearly everything, with significant weight loss, symptoms of dehydration      I started vomiting last night    I feel bad, then I vomit.    2. ONSET: "When did the vomiting begin?"      Last night    10:00 AM last time I vomited.      I took [employer].     3. FLUIDS: "What fluids or food have you vomited up today?" "Have you been able to keep any fluids down?"     I'm drinking water.    4. ABDOMEN PAIN: "Are your having any abdomen pain?" If Yes : "How bad is it and what does it feel like?" (e.g., crampy, dull, intermittent, constant)      Right before I vomit I have cramps. 5. DIARRHEA: "Is there any diarrhea?" If Yes, ask: "How many times today?"      I'm having loose stools 6. CONTACTS: "Is there anyone else in the family with the same symptoms?"      No 7. CAUSE: "What do you think is causing your vomiting?"     I don't know 8. HYDRATION STATUS: "Any signs of dehydration?" (e.g., dry mouth [not only dry lips], too weak to stand)  "When did you last urinate?"     No 9. OTHER SYMPTOMS: "Do you have any other symptoms?" (e.g., fever, headache, vertigo, vomiting blood or coffee grounds, recent head injury)     No 10. PREGNANCY: "Is there any chance you are pregnant?" "When was your last menstrual period?"       N/A  Protocols used: Vomiting-A-AH

## 2022-08-13 NOTE — Patient Instructions (Signed)
-  It was a pleasure to see you today! Please review your visit summary for helpful information -I would encourage you to follow your care via MyChart where you can access lab results, notes, messages, and more -If you feel that we did a nice job today, please complete your after-visit survey and leave [place] a Google review! Your CMA today was Mariann Barter and your provider was Alvester Morin, PA-C, DMSc -Please return for follow-up in about 10-14 days

## 2022-08-14 ENCOUNTER — Telehealth: Payer: Self-pay

## 2022-08-14 NOTE — Telephone Encounter (Signed)
Called pt to follow up on the reason for no show on [place] yesterday- he stated he had a flat tire and he called the hospital to tell them. It was not relayed to the [place] dept. He has asked that it be rescheduled- we are working on that at this time

## 2022-08-17 ENCOUNTER — Ambulatory Visit: Admission: RE | Admit: 2022-08-17 | Payer: BC Managed Care – PPO | Source: Ambulatory Visit

## 2022-08-18 ENCOUNTER — Ambulatory Visit
Admission: RE | Admit: 2022-08-18 | Discharge: 2022-08-18 | Disposition: A | Discharge status: Home / Self Care | Payer: BC Managed Care – PPO | Source: Ambulatory Visit | Attending: Physician Assistant | Admitting: Physician Assistant

## 2022-08-18 DIAGNOSIS — R1011 Right upper quadrant pain: Secondary | ICD-10-CM | POA: Insufficient documentation

## 2022-08-18 DIAGNOSIS — R1110 Vomiting, unspecified: Secondary | ICD-10-CM | POA: Diagnosis not present

## 2022-08-19 NOTE — Progress Notes (Signed)
PC to pt, unable to leave message, CRM created PEC may give results

## 2022-08-26 DIAGNOSIS — M545 Low back pain, unspecified: Secondary | ICD-10-CM | POA: Diagnosis not present

## 2022-08-26 DIAGNOSIS — M47896 Other spondylosis, lumbar region: Secondary | ICD-10-CM | POA: Diagnosis not present

## 2022-08-28 ENCOUNTER — Ambulatory Visit: Payer: BC Managed Care – PPO | Admitting: Physician Assistant

## 2022-08-28 DIAGNOSIS — M1612 Unilateral primary osteoarthritis, left hip: Secondary | ICD-10-CM | POA: Insufficient documentation

## 2022-09-02 ENCOUNTER — Ambulatory Visit: Payer: BC Managed Care – PPO | Admitting: Physician Assistant

## 2022-09-24 ENCOUNTER — Telehealth: Payer: Self-pay

## 2022-09-24 NOTE — Telephone Encounter (Signed)
Please call pt for a follow up appointment for DM.  KP

## 2022-09-24 NOTE — Telephone Encounter (Signed)
Noted  KP 

## 2022-10-16 ENCOUNTER — Ambulatory Visit: Payer: BC Managed Care – PPO | Admitting: Physician Assistant

## 2022-11-02 ENCOUNTER — Other Ambulatory Visit: Payer: Self-pay | Admitting: Physician Assistant

## 2022-11-02 NOTE — Telephone Encounter (Signed)
Medication Refill - Medication: DULoxetine (CYMBALTA) 60 MG capsule   Has the patient contacted their pharmacy? Yes.   (Agent: If no, request that the patient contact the pharmacy for the refill. If patient does not wish to contact the pharmacy document the reason why and proceed with request.) (Agent: If yes, when and what did the pharmacy advise?)  Preferred [facility]  [address] [place], [place] [address] [phone] (with phone number or street name):  Has the patient been seen for an appointment in the last year OR does the patient have an upcoming appointment? Yes.    Agent: Please be advised that RX refills may take up to 3 business days. We ask that you follow-up with your pharmacy.

## 2022-11-02 NOTE — Telephone Encounter (Signed)
Medication Refill - Medication: DULoxetine (CYMBALTA) 60 MG capsule. (Caller stated all 3 [facility] were out of medication, [facility] [place])   Has the patient contacted their pharmacy? Yes.     (Agent: If yes, when and what did the pharmacy advise?) Contact PCP office  Preferred Pharmacy (with phone number or street name):  [facility]  [address] [place], [place] [address] [phone]  Has the patient been seen for an appointment in the last year OR does the patient have an upcoming appointment? Yes.    Agent: Please be advised that RX refills may take up to 3 business days. We ask that you follow-up with your pharmacy.

## 2022-11-02 NOTE — Telephone Encounter (Signed)
Requested medication (s) are due for refill today: Yes  Requested medication (s) are on the active medication list: Yes  Last refill:  11/03/21  Future visit scheduled: Yes  Notes to clinic:  Unable to refill per protocol, last refill by another provider.      Requested Prescriptions  Pending Prescriptions Disp Refills   DULoxetine (CYMBALTA) 60 MG capsule      Sig: Take 1 capsule (60 mg total) by mouth daily.     Psychiatry: Antidepressants - SNRI - duloxetine Passed - 11/02/2022 12:39 PM      Passed - Cr in normal range and within 360 days    Creatinine  Date Value Ref Range Status  04/28/2022 0.6 0.6 - 1.3 Final  02/27/2014 1.18 0.60 - 1.30 mg/dL Final   Creatinine, Ser  Date Value Ref Range Status  11/03/2021 0.61 0.61 - 1.24 mg/dL Final   Creatinine, Urine  Date Value Ref Range Status  11/06/2021 103.2  Final         Passed - eGFR is 30 or above and within 360 days    EGFR (African American)  Date Value Ref Range Status  02/27/2014 >60 >56mL/min Final  08/15/2011 >60  Final   GFR calc Af Amer  Date Value Ref Range Status  08/06/2015 >60 >60 mL/min Final    Comment:    (NOTE) The eGFR has been calculated using the CKD EPI equation. This calculation has not been validated in all clinical situations. eGFR's persistently <60 mL/min signify possible Chronic Kidney Disease.    EGFR (Non-African Amer.)  Date Value Ref Range Status  02/27/2014 >60 >47mL/min Final    Comment:    eGFR values <38mL/min/1.73 m2 may be an indication of chronic kidney disease (CKD). Calculated eGFR, using the MRDR Study equation, is useful in  patients with stable renal function. The eGFR calculation will not be reliable in acutely ill patients when serum creatinine is changing rapidly. It is not useful in patients on dialysis. The eGFR calculation may not be applicable to patients at the low and high extremes of body sizes, pregnant women, and vegetarians.   08/15/2011 >60   Final    Comment:    eGFR values <66mL/min/1.73 m2 may be an indication of chronic kidney disease (CKD). Calculated eGFR is useful in patients with stable renal function. The eGFR calculation will not be reliable in acutely ill patients when serum creatinine is changing rapidly. It is not useful in  patients on dialysis. The eGFR calculation may not be applicable to patients at the low and high extremes of body sizes, pregnant women, and vegetarians.    GFR, Estimated  Date Value Ref Range Status  11/03/2021 >60 >60 mL/min Final    Comment:    (NOTE) Calculated using the CKD-EPI Creatinine Equation (2021)    eGFR  Date Value Ref Range Status  04/28/2022 118  Final         Passed - Completed PHQ-2 or PHQ-9 in the last 360 days      Passed - Last BP in normal range    BP Readings from Last 1 Encounters:  08/13/22 112/82         Passed - Valid encounter within last 6 months    Recent Outpatient Visits           2 months ago Vomiting without nausea, unspecified vomiting type   [facility] at [facility], [place], [place]   5 months ago  Generalized anxiety disorder   [facility] Medicine at [facility], Melton Alar, [place]       Future Appointments             In 1 week Mordecai Maes, [place] Alar, PA [facility] Primary Care & Sports Medicine at [facility], [facility]

## 2022-11-02 NOTE — Telephone Encounter (Signed)
Please review.  KP

## 2022-11-03 ENCOUNTER — Telehealth: Payer: Self-pay | Admitting: Physician Assistant

## 2022-11-03 NOTE — Telephone Encounter (Signed)
Copied from CRM [id number]. Topic: General - Other >> Nov 03, 2022  4:33 PM Dondra Prader A wrote: Reason for CRM: Pt wife is calling in regarding Pt message from today with PCP. The correct dosage pt is taking of medication is duloxetine 60 mg and he last took the medication yesterday. Pt wife is requesting a 30 day supply of medication and have it sent to: [id number] Nicholes Rough,  - [address] AT [facility] OF [address]  Phone: [phone] Fax: [phone]

## 2022-11-03 NOTE — Telephone Encounter (Signed)
Please review.  KP

## 2022-11-04 MED ORDER — DULOXETINE HCL 60 MG PO CPEP: 60.0000 mg | ORAL_CAPSULE | Freq: Every day | ORAL | 3 refills | Status: DC

## 2022-11-04 NOTE — Telephone Encounter (Signed)
Please review.  KP

## 2022-11-09 ENCOUNTER — Ambulatory Visit: Payer: BC Managed Care – PPO | Admitting: Physician Assistant

## 2022-11-23 ENCOUNTER — Other Ambulatory Visit: Payer: Self-pay | Admitting: Physician Assistant

## 2022-11-23 NOTE — Telephone Encounter (Signed)
Medication Refill - Medication: metformin (FORTAMET) 500 MG (OSM) 24 hr tablet and rosuvastatin (CRESTOR) 40 MG tablet   Has the patient contacted their pharmacy? Yes.    Preferred Pharmacy (with phone number or street name):  [facility] #[id number] - [place], [place] - [address] [place] [address] AT [facility] [address] [facility] [address] Phone: [phone]  Fax: [phone]     Has the patient been seen for an appointment in the last year OR does the patient have an upcoming appointment? Yes.    Agent: Please be advised that RX refills may take up to 3 business days. We ask that you follow-up with your pharmacy.

## 2022-11-24 NOTE — Telephone Encounter (Signed)
Requested medication (s) are due for refill today: yes  Requested medication (s) are on the active medication list: yes  Last refill:  multiple dates  Future visit scheduled: no  Notes to clinic:  Unable to refill per protocol, last refill by historical provider.      Requested Prescriptions  Pending Prescriptions Disp Refills   metformin (FORTAMET) 500 MG (OSM) 24 hr tablet      Sig: Take 1 tablet (500 mg total) by mouth daily.     Endocrinology:  Diabetes - Biguanides Failed - 11/23/2022  1:39 PM      Failed - HBA1C is between 0 and 7.9 and within 180 days    Hemoglobin A1C  Date Value Ref Range Status  04/28/2022 8.3  Final         Failed - B12 Level in normal range and within 720 days    No results found for: "VITAMINB12"       Failed - CBC within normal limits and completed in the last 12 months    WBC  Date Value Ref Range Status  04/28/2022 8.1  Final  11/03/2021 8.8 4.0 - 10.5 K/uL Final   RBC  Date Value Ref Range Status  04/28/2022 4.94 3.87 - 5.11 Final   Hemoglobin  Date Value Ref Range Status  04/28/2022 15.0 13.5 - 17.5 Final   HGB  Date Value Ref Range Status  02/27/2014 16.5 13.0 - 18.0 g/dL Final   HCT  Date Value Ref Range Status  04/28/2022 43 41 - 53 Final  02/27/2014 49.7 40.0 - 52.0 % Final   MCHC  Date Value Ref Range Status  11/03/2021 33.5 30.0 - 36.0 g/dL Final   [facility]  Date Value Ref Range Status  11/03/2021 29.3 26.0 - 34.0 pg Final   MCV  Date Value Ref Range Status  11/03/2021 87.5 80.0 - 100.0 fL Final  02/27/2014 89 80 - 100 fL Final   No results found for: "PLTCOUNTKUC", "LABPLAT", "POCPLA" RDW  Date Value Ref Range Status  11/03/2021 13.1 11.5 - 15.5 % Final  02/27/2014 13.3 11.5 - 14.5 % Final         Passed - Cr in normal range and within 360 days    Creatinine  Date Value Ref Range Status  04/28/2022 0.6 0.6 - 1.3 Final  02/27/2014 1.18 0.60 - 1.30 mg/dL Final   Creatinine, Ser  Date Value Ref Range Status   11/03/2021 0.61 0.61 - 1.24 mg/dL Final   Creatinine, Urine  Date Value Ref Range Status  11/06/2021 103.2  Final         Passed - eGFR in normal range and within 360 days    EGFR (African American)  Date Value Ref Range Status  02/27/2014 >60 >76mL/min Final  08/15/2011 >60  Final   GFR calc Af Amer  Date Value Ref Range Status  08/06/2015 >60 >60 mL/min Final    Comment:    (NOTE) The eGFR has been calculated using the CKD EPI equation. This calculation has not been validated in all clinical situations. eGFR's persistently <60 mL/min signify possible Chronic Kidney Disease.    EGFR (Non-African Amer.)  Date Value Ref Range Status  02/27/2014 >60 >68mL/min Final    Comment:    eGFR values <12mL/min/1.73 m2 may be an indication of chronic kidney disease (CKD). Calculated eGFR, using the MRDR Study equation, is useful in  patients with stable renal function. The eGFR calculation will not be reliable in acutely ill patients  when serum creatinine is changing rapidly. It is not useful in patients on dialysis. The eGFR calculation may not be applicable to patients at the low and high extremes of body sizes, pregnant women, and vegetarians.   08/15/2011 >60  Final    Comment:    eGFR values <72mL/min/1.73 m2 may be an indication of chronic kidney disease (CKD). Calculated eGFR is useful in patients with stable renal function. The eGFR calculation will not be reliable in acutely ill patients when serum creatinine is changing rapidly. It is not useful in  patients on dialysis. The eGFR calculation may not be applicable to patients at the low and high extremes of body sizes, pregnant women, and vegetarians.    GFR, Estimated  Date Value Ref Range Status  11/03/2021 >60 >60 mL/min Final    Comment:    (NOTE) Calculated using the CKD-EPI Creatinine Equation (2021)    eGFR  Date Value Ref Range Status  04/28/2022 118  Final         Passed - Valid encounter within  last 6 months    Recent Outpatient Visits           3 months ago Vomiting without nausea, unspecified vomiting type   [facility] Primary [facility] at [facility], Mel[place], [place]   6 months ago Generalized anxiety disorder   [facility] Primary Care & [facility] Medicine at [facility], [place], [place]               rosuvastatin (CRESTOR) 40 MG tablet      Sig: Take 1 tablet (40 mg total) by mouth daily.     Cardiovascular:  Antilipid - Statins 2 Failed - 11/23/2022  1:39 PM      Failed - Lipid Panel in normal range within the last 12 months    Cholesterol  Date Value Ref Range Status  11/06/2021 267 (A) 0 - 200 Final   LDL Cholesterol  Date Value Ref Range Status  11/06/2021 181  Final   HDL  Date Value Ref Range Status  11/06/2021 41 35 - 70 Final   Triglycerides  Date Value Ref Range Status  11/06/2021 224 (A) 40 - 160 Final         Passed - Cr in normal range and within 360 days    Creatinine  Date Value Ref Range Status  04/28/2022 0.6 0.6 - 1.3 Final  02/27/2014 1.18 0.60 - 1.30 mg/dL Final   Creatinine, Ser  Date Value Ref Range Status  11/03/2021 0.61 0.61 - 1.24 mg/dL Final   Creatinine, Urine  Date Value Ref Range Status  11/06/2021 103.2  Final         Passed - Patient is not pregnant      Passed - Valid encounter within last 12 months    Recent Outpatient Visits           3 months ago Vomiting without nausea, unspecified vomiting type   [facility] Primary Care & Sports Medicine at [facility], [place], [place]   6 months ago Generalized anxiety disorder   [facility] Medicine at [facility], Mel[place] Alar, [place]

## 2022-11-25 MED ORDER — METFORMIN HCL ER 500 MG PO TB24: 500.0000 mg | ORAL_TABLET | Freq: Every day | ORAL | 1 refills | Status: DC

## 2022-11-25 MED ORDER — ROSUVASTATIN CALCIUM 40 MG PO TABS: 40.0000 mg | ORAL_TABLET | Freq: Every day | ORAL | 1 refills | Status: DC

## 2023-02-28 ENCOUNTER — Other Ambulatory Visit: Payer: Self-pay | Admitting: Physician Assistant

## 2023-03-01 NOTE — Telephone Encounter (Signed)
Requested Prescriptions  Pending Prescriptions Disp Refills   DULoxetine (CYMBALTA) 60 MG capsule [Pharmacy Med Name: DULOXETINE DR 60MG  CAPSULES] 30 capsule 3    Sig: TAKE 1 CAPSULE(60 MG) BY MOUTH DAILY     Psychiatry: Antidepressants - SNRI - duloxetine Failed - 03/01/2023  3:14 PM      Failed - Valid encounter within last 6 months    Recent Outpatient Visits           6 months ago Vomiting without nausea, unspecified vomiting type   [facility] Primary Care & Sports Medicine at [facility], [place], [place]   9 months ago Generalized anxiety disorder   [facility] Primary Care & Sports Medicine at [facility], Mel[place] Alar, [place]              Passed - Cr in normal range and within 360 days    Creatinine  Date Value Ref Range Status  04/28/2022 0.6 0.6 - 1.3 Final  02/27/2014 1.18 0.60 - 1.30 mg/dL Final   Creatinine, Ser  Date Value Ref Range Status  11/03/2021 0.61 0.61 - 1.24 mg/dL Final   Creatinine, Urine  Date Value Ref Range Status  11/06/2021 103.2  Final         Passed - eGFR is 30 or above and within 360 days    EGFR (African American)  Date Value Ref Range Status  02/27/2014 >60 >53mL/min Final  08/15/2011 >60  Final   GFR calc Af Amer  Date Value Ref Range Status  08/06/2015 >60 >60 mL/min Final    Comment:    (NOTE) The eGFR has been calculated using the CKD EPI equation. This calculation has not been validated in all clinical situations. eGFR's persistently <60 mL/min signify possible Chronic Kidney Disease.    EGFR (Non-African Amer.)  Date Value Ref Range Status  02/27/2014 >60 >4mL/min Final    Comment:    eGFR values <9mL/min/1.73 m2 may be an indication of chronic kidney disease (CKD). Calculated eGFR, using the MRDR Study equation, is useful in  patients with stable renal function. The eGFR calculation will not be reliable in acutely ill patients when serum creatinine is changing rapidly. It is not useful  in patients on dialysis. The eGFR calculation may not be applicable to patients at the low and high extremes of body sizes, pregnant women, and vegetarians.   08/15/2011 >60  Final    Comment:    eGFR values <62mL/min/1.73 m2 may be an indication of chronic kidney disease (CKD). Calculated eGFR is useful in patients with stable renal function. The eGFR calculation will not be reliable in acutely ill patients when serum creatinine is changing rapidly. It is not useful in  patients on dialysis. The eGFR calculation may not be applicable to patients at the low and high extremes of body sizes, pregnant women, and vegetarians.    GFR, Estimated  Date Value Ref Range Status  11/03/2021 >60 >60 mL/min Final    Comment:    (NOTE) Calculated using the CKD-EPI Creatinine Equation (2021)    eGFR  Date Value Ref Range Status  04/28/2022 118  Final         Passed - Completed PHQ-2 or PHQ-9 in the last 360 days      Passed - Last BP in normal range    BP Readings from Last 1 Encounters:  08/13/22 112/82

## 2023-03-24 ENCOUNTER — Ambulatory Visit: Payer: Self-pay | Admitting: *Deleted

## 2023-03-24 NOTE — Telephone Encounter (Signed)
  Chief Complaint: lump under left arm- getting larger causing pain in shoulder   Symptoms: pain under left arm- at lump- pain in shoulder and arm Frequency: patient states he has had lump for a long time without symptoms- recently started getting painful- causing pain in shoulder and arm Pertinent Negatives: Patient denies discoloration, fever, itching Disposition: [] ED /[] Urgent Care (no appt availability in office) / [x] Appointment(In office/virtual)/ []  [facility]/ [] Home Care/ [] Refused Recommended Disposition /[] [facility]/ []  Follow-up with PCP Additional Notes: Patient states he is having pain and changes in lump under left arm. Patient states he has pain that radiates from lump into the shoulder and he has tingling in hand

## 2023-03-24 NOTE — Telephone Encounter (Signed)
 Reason for Disposition  [1] Swelling is painful to touch AND [2] no fever  Answer Assessment - Initial Assessment Questions 1. APPEARANCE of SWELLING: What does it look like?     Pennny sized lump- no discoloration  2. SIZE: How large is the swelling? (e.g., inches, cm; or compare to size of pinhead, tip of pen, eraser, coin, pea, grape, ping pong ball)      Penny size 3. LOCATION: Where is the swelling located?     Left underarm 4. ONSET: When did the swelling start?     Patient states he has had lump for a long time 5. COLOR: What color is it? Is there more than one color?     Normal skin color 6. PAIN: Is there any pain? If Yes, ask: How bad is the pain? (e.g., scale 1-10; or mild, moderate, severe)     - NONE (0): no pain   - MILD (1-3): doesn't interfere with normal activities    - MODERATE (4-7): interferes with normal activities or awakens from sleep    - SEVERE (8-10): excruciating pain, unable to do any normal activities     Radiates into shoulder- 6-7/10, depends of position 7. ITCH: Does it itch? If Yes, ask: How bad is the itch?      no 8. CAUSE: What do you think caused the swelling?      Unsure- over 1 year- getting bigger 9 OTHER SYMPTOMS: Do you have any other symptoms? (e.g., fever)     Tingling in hand, shoulder pain  Protocols used: Skin Lump or Localized Swelling-A-AH

## 2023-03-25 ENCOUNTER — Encounter: Payer: Self-pay | Admitting: Physician Assistant

## 2023-03-25 ENCOUNTER — Ambulatory Visit: Payer: BC Managed Care – PPO | Admitting: Physician Assistant

## 2023-03-25 VITALS — BP 102/70 | HR 83 | Temp 97.8°F | Ht 70.0 in | Wt 192.0 lb

## 2023-03-25 DIAGNOSIS — M25512 Pain in left shoulder: Secondary | ICD-10-CM

## 2023-03-25 DIAGNOSIS — E119 Type 2 diabetes mellitus without complications: Secondary | ICD-10-CM

## 2023-03-25 DIAGNOSIS — Z7984 Long term (current) use of oral hypoglycemic drugs: Secondary | ICD-10-CM | POA: Diagnosis not present

## 2023-03-25 NOTE — Patient Instructions (Addendum)
-  It was a pleasure to see you today! Please review your visit summary for helpful information -I would encourage you to follow your care via MyChart where you can access lab results, notes, messages, and more -If you feel that we did a nice job today, please complete your after-visit survey and leave us  a Google review! Your CMA today was Kieandra and your provider was Rolan Hoyle, PA-C, DMSc -Please return for follow-up in about 2 weeks  Bring me any recent lab work or notes done by work

## 2023-03-25 NOTE — Progress Notes (Signed)
 Date:  03/25/2023   Name:  Derrick Joyce   DOB:  1971-02-17   MRN:  [id number]   Chief Complaint: Mass (X 1-2 week,Left side, comes and goes,Under arm, causing shoulder pain and fingers to tingle )  HPI Derrick Joyce presents today for evaluation of left shoulder/muscular chest pain and an axillary mass present for the last couple weeks.  No specific injury.  Pain is worse when seated and relieved by standing.  History of spinal issues.  Currently using Tylenol  Precision (topical) with some relief.  Has had good results with Voltaren gel in the past, and intends to get more soon.  He states the mass has come and gone in the past, suspects it is a lymph node, slightly tender.  He has not been compliant with routine care, due for physical exam, diabetes is adequately controlled.  Says he had some type of employee health screening recently with some lab work done including cholesterol and A1c, thinks his A1c was ~8%.    Medication list has been reviewed and updated.  Current Meds  Medication Sig   DULoxetine  (CYMBALTA ) 60 MG capsule TAKE 1 CAPSULE(60 MG) BY MOUTH DAILY   IBUPROFEN PO Take by mouth daily.   metformin  (GLUCOPHAGE -XR) 500 MG 24 hr tablet Take 1 tablet (500 mg total) by mouth daily.   ondansetron  (ZOFRAN ) 8 MG tablet Take 1 tablet (8 mg total) by mouth every 8 (eight) hours as needed for nausea or vomiting.   rosuvastatin  (CRESTOR ) 40 MG tablet Take 1 tablet (40 mg total) by mouth daily.     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   Type 2 diabetes mellitus without  complication, without long-term current use of insulin  (HCC) 09/23/2016   Spondylolisthesis of lumbar region 08/05/2015    Allergies  Allergen Reactions   Gabapentin  Other (See Comments)    Suicidal ideation   Cephalexin Rash    Immunization History  Administered Date(s) Administered   PFIZER(Purple Top)SARS-COV-2 Vaccination 06/01/2019, 06/27/2019    Past Surgical History:  Procedure Laterality Date   APPENDECTOMY     BACK SURGERY     HERNIA REPAIR     TYMPANOPLASTY      Social History   Tobacco Use   Smoking status: Never   Smokeless tobacco: Never  Vaping Use   Vaping status: Never Used  Substance Use Topics   Alcohol use: No   Drug use: Yes    Types: Marijuana    Family History  Problem Relation Age of Onset   Bipolar disorder Mother    Diabetes Father    Diabetes Paternal Grandmother    Cancer Paternal Grandmother    Diabetes Paternal Grandfather    Cancer Paternal Grandfather    Heart attack Paternal Grandfather         03/25/2023   11:09 AM 08/13/2022    1:31 PM 05/20/2022    3:20 PM  GAD 7 : Generalized Anxiety Score  Nervous, Anxious, on Edge 3 3 3   Control/stop worrying 3 3 3   Worry too much - different things 3 3 3   Trouble relaxing 3 3 3  Restless 3 3 3   Easily annoyed or irritable 3 3 3   Afraid - awful might happen 3 3 3   Total GAD 7 Score 21 21 21   Anxiety Difficulty Somewhat difficult Somewhat difficult Extremely difficult       03/25/2023   11:09 AM 08/13/2022    1:31 PM 05/20/2022    3:20 PM  Depression screen PHQ 2/9  Decreased Interest 1 1 3   Down, Depressed, Hopeless 1 1 3   PHQ - 2 Score 2 2 6   Altered sleeping 3 3 3   Tired, decreased energy 3 3 3   Change in appetite 0 1 0  Feeling bad or failure about yourself  2 3 3   Trouble concentrating 2 3 3   Moving slowly or fidgety/restless 2 3 3   Suicidal thoughts 0 0 0  PHQ-9 Score 14 18 21   Difficult doing work/chores Somewhat difficult Somewhat difficult Extremely dIfficult     BP Readings from Last 3 Encounters:  03/25/23 102/70  08/13/22 112/82  05/20/22 110/72    Wt Readings from Last 3 Encounters:  03/25/23 192 lb (87.1 kg)  08/13/22 177 lb (80.3 kg)  05/20/22 187 lb (84.8 kg)    BP 102/70   Pulse 83   Temp 97.8 F (36.6 C)   Ht 5' 10 (1.778 m)   Wt 192 lb (87.1 kg)   SpO2 98%   BMI 27.55 kg/m   Physical Exam Vitals and nursing note reviewed.  Constitutional:      Appearance: Normal appearance.  Cardiovascular:     Rate and Rhythm: Normal rate.  Pulmonary:     Effort: Pulmonary effort is normal.  Abdominal:     General: There is no distension.  Musculoskeletal:        General: Normal range of motion.     Comments: Full ROM left shoulder. Left upper chest wall mildly tender to palpation.   Lymphadenopathy:     Upper Body:     Left upper body: Axillary adenopathy (7 mm mobile, semi-firm, slightly tender without drainage) present.  Skin:    General: Skin is warm and dry.  Neurological:     Mental Status: He is alert and oriented to person, place, and time.     Gait: Gait is intact.  Psychiatric:        Mood and Affect: Mood and affect normal.     Recent Labs     Component Value Date/Time   NA 137 04/28/2022 0000   NA 137 02/27/2014 1650   K 3.9 04/28/2022 0000   K 3.8 02/27/2014 1650   CL 103 04/28/2022 0000   CL 104 02/27/2014 1650   CO2 29 (A) 04/28/2022 0000   CO2 24 02/27/2014 1650   GLUCOSE 160 (H) 11/03/2021 1405   GLUCOSE 110 (H) 02/27/2014 1650   BUN 10 04/28/2022 0000   BUN 16 02/27/2014 1650   CREATININE 0.6 04/28/2022 0000   CREATININE 0.61 11/03/2021 1405   CREATININE 1.18 02/27/2014 1650   CALCIUM  9.4 04/28/2022 0000   CALCIUM  9.3 02/27/2014 1650   PROT 7.8 11/03/2021 1405   PROT 7.5 02/27/2014 1650   ALBUMIN 4.4 04/28/2022 0000   ALBUMIN 4.1 02/27/2014 1650   AST 16 04/28/2022 0000   AST 17 02/27/2014 1650   ALT 21 04/28/2022 0000   ALT 28 02/27/2014 1650   ALKPHOS 75 04/28/2022 0000    ALKPHOS 63 02/27/2014 1650   BILITOT 0.9 11/03/2021 1405   BILITOT 0.8 02/27/2014 1650   GFRNONAA >60 11/03/2021 1405  GFRNONAA >60 02/27/2014 1650   GFRNONAA >60 08/15/2011 0854   GFRAA >60 08/06/2015 0404   GFRAA >60 02/27/2014 1650   GFRAA >60 08/15/2011 0854    Lab Results  Component Value Date   WBC 8.1 04/28/2022   HGB 15.0 04/28/2022   HCT 43 04/28/2022   MCV 87.5 11/03/2021   PLT 236 04/28/2022   Lab Results  Component Value Date   HGBA1C 8.3 04/28/2022   Lab Results  Component Value Date   CHOL 267 (A) 11/06/2021   HDL 41 11/06/2021   LDLCALC 181 11/06/2021   TRIG 224 (A) 11/06/2021   Lab Results  Component Value Date   TSH 3.45 11/06/2021   TSH 4.70 11/06/2021     Assessment and Plan:  1. Acute pain of left shoulder (Primary) Not immediately concerning.  Agree that left axillary mass is probably lymph node and likely to spontaneously resolve with time.  Encouraged to use OTC diclofenac gel.  2. Type 2 diabetes mellitus without complication, without long-term current use of insulin  (HCC) Emphasized the importance of routine care with the patient.  With inadequately controlled diabetes, he will need appointment at least every 3 months.  Will bring him back for short interval follow-up for routine physical exam with labs.  He will bring any recently done labs and notes from work.    Return in about 2 weeks (around 04/08/2023) for CPE, DM2.    Rolan Hoyle, PA-C, DMSc, Nutritionist [facility] Primary [facility] [phone]

## 2023-03-28 ENCOUNTER — Other Ambulatory Visit: Payer: Self-pay | Admitting: Physician Assistant

## 2023-03-30 DIAGNOSIS — R202 Paresthesia of skin: Secondary | ICD-10-CM | POA: Diagnosis not present

## 2023-03-30 DIAGNOSIS — R200 Anesthesia of skin: Secondary | ICD-10-CM | POA: Diagnosis not present

## 2023-03-30 NOTE — Telephone Encounter (Signed)
 Requested Prescriptions  Pending Prescriptions Disp Refills   DULoxetine  (CYMBALTA ) 60 MG capsule [Pharmacy Med Name: DULOXETINE  DR 60MG  CAPSULES] 90 capsule 0    Sig: TAKE 1 CAPSULE(60 MG) BY MOUTH DAILY     Psychiatry: Antidepressants - SNRI - duloxetine  Failed - 03/30/2023 10:09 AM      Failed - Valid encounter within last 6 months    Recent Outpatient Visits           5 days ago Acute pain of left shoulder   [facility] at [facility], Toribio SQUIBB, [place]   7 months ago Vomiting without nausea, unspecified vomiting type   [facility] Medicine at [facility], [place]ibio [place]QU[place]B, [place]   10 months ago Generalized anxiety disorder   [facility] & [facility] Medicine at [facility], Toribio SQUIBB, [place]       Future Appointments             In 1 week Many[place], Toribio SQUIBB, [place] [facility] Primary Care & Sports Medicine at [facility], [facility]            Passed - Cr in normal range and within 360 days    Creatinine  Date Value Ref Range Status  04/28/2022 0.6 0.6 - 1.3 Final  02/27/2014 1.18 0.60 - 1.30 mg/dL Final   Creatinine, Ser  Date Value Ref Range Status  11/03/2021 0.61 0.61 - 1.24 mg/dL Final   Creatinine, Urine  Date Value Ref Range Status  11/06/2021 103.2  Final         Passed - eGFR is 30 or above and within 360 days    EGFR (African American)  Date Value Ref Range Status  02/27/2014 >60 >50mL/min Final  08/15/2011 >60  Final   GFR calc Af Amer  Date Value Ref Range Status  08/06/2015 >60 >60 mL/min Final    Comment:    (NOTE) The eGFR has been calculated using the CKD EPI equation. This calculation has not been validated in all clinical situations. eGFR's persistently <60 mL/min signify possible Chronic Kidney Disease.    EGFR (Non-African Amer.)  Date Value Ref Range Status  02/27/2014 >60 >77mL/min Final    Comment:    eGFR values <47mL/min/1.73 m2 may be an indication  of chronic kidney disease (CKD). Calculated eGFR, using the MRDR Study equation, is useful in  patients with stable renal function. The eGFR calculation will not be reliable in acutely ill patients when serum creatinine is changing rapidly. It is not useful in patients on dialysis. The eGFR calculation may not be applicable to patients at the low and high extremes of body sizes, pregnant women, and vegetarians.   08/15/2011 >60  Final    Comment:    eGFR values <64mL/min/1.73 m2 may be an indication of chronic kidney disease (CKD). Calculated eGFR is useful in patients with stable renal function. The eGFR calculation will not be reliable in acutely ill patients when serum creatinine is changing rapidly. It is not useful in  patients on dialysis. The eGFR calculation may not be applicable to patients at the low and high extremes of body sizes, pregnant women, and vegetarians.    GFR, Estimated  Date Value Ref Range Status  11/03/2021 >60 >60 mL/min Final    Comment:    (NOTE) Calculated using the CKD-EPI Creatinine Equation (2021)    eGFR  Date Value Ref Range Status  04/28/2022 118  Final  Passed - Completed PHQ-2 or PHQ-9 in the last 360 days      Passed - Last BP in normal range    BP Readings from Last 1 Encounters:  03/25/23 102/70

## 2023-04-07 DIAGNOSIS — M25512 Pain in left shoulder: Secondary | ICD-10-CM | POA: Diagnosis not present

## 2023-04-07 DIAGNOSIS — G5602 Carpal tunnel syndrome, left upper limb: Secondary | ICD-10-CM | POA: Diagnosis not present

## 2023-04-08 ENCOUNTER — Ambulatory Visit: Payer: BC Managed Care – PPO | Admitting: Physician Assistant

## 2023-04-08 ENCOUNTER — Encounter: Payer: Self-pay | Admitting: Physician Assistant

## 2023-04-08 ENCOUNTER — Other Ambulatory Visit: Payer: Self-pay

## 2023-04-08 ENCOUNTER — Telehealth: Payer: Self-pay

## 2023-04-08 ENCOUNTER — Encounter: Payer: BC Managed Care – PPO | Admitting: Physician Assistant

## 2023-04-08 VITALS — BP 108/86 | HR 77 | Temp 97.8°F | Ht 70.0 in | Wt 187.0 lb

## 2023-04-08 DIAGNOSIS — F319 Bipolar disorder, unspecified: Secondary | ICD-10-CM | POA: Diagnosis not present

## 2023-04-08 DIAGNOSIS — F1290 Cannabis use, unspecified, uncomplicated: Secondary | ICD-10-CM | POA: Diagnosis not present

## 2023-04-08 DIAGNOSIS — E119 Type 2 diabetes mellitus without complications: Secondary | ICD-10-CM

## 2023-04-08 DIAGNOSIS — Z7984 Long term (current) use of oral hypoglycemic drugs: Secondary | ICD-10-CM

## 2023-04-08 LAB — POCT GLYCOSYLATED HEMOGLOBIN (HGB A1C): Hemoglobin A1C: 8.9 % — AB (ref 4.0–5.6)

## 2023-04-08 MED ORDER — LITHIUM CARBONATE 300 MG PO TABS: 300.0000 mg | ORAL_TABLET | Freq: Three times a day (TID) | ORAL | 1 refills | Status: DC

## 2023-04-08 NOTE — Patient Instructions (Signed)
-  It was a pleasure to see you today! Please review your visit summary for helpful information -I would encourage you to follow your care via MyChart where you can access lab results, notes, messages, and more -If you feel that we did a nice job today, please complete your after-visit survey and leave us a Google review! Your CMA today was Kieandra and your provider was Dan Waddell, PA-C, DMSc -Please return for follow-up in about 1 month  

## 2023-04-08 NOTE — Progress Notes (Addendum)
 Date:  04/08/2023   Name:  Derrick Joyce   DOB:  1971/01/05   MRN:  [id number]   Chief Complaint: Referral (Psych- has mood swings, depression, anxiety, wife states pt goes from on extreme to the other ) and Diabetes (Wants a new medication has some concerns about taking/)  HPI Derrick Joyce presents today long overdue for routine follow-up, last seen by me 05/20/2022, here for management of chronic conditions especially diabetes and mood disorders.  Joined by his wife Derrick Joyce.  After an episode of dissociative amnesia with dissociative fugue early last year, I submitted an urgent referral to psychiatry, which he did not attend.  He has since rescheduled, but does not have an appointment until May.  Derrick Joyce says his mood is labile, with dramatic mood changes over the course of hours.  In addition to this, he has periods of depression for days or weeks at a time.  He reports good compliance with duloxetine.  Continues to use marijuana despite my advice that he should quit.  Initially today he told me he uses this occasionally, but when pressed he admitted to use at least once daily.  He has been compliant with metformin per his report, no major side effects.  Puts a lot of sugary creamer in his coffee in the morning and tends to eat high carbohydrate diet.  Long overdue for A1c repeat   Medication list has been reviewed and updated.  Current Meds  Medication Sig   DULoxetine (CYMBALTA) 60 MG capsule TAKE 1 CAPSULE(60 MG) BY MOUTH DAILY   IBUPROFEN PO Take by mouth daily.   lithium 300 MG tablet Take 1 tablet (300 mg total) by mouth 3 (three) times daily.   metformin (GLUCOPHAGE-XR) 500 MG 24 hr tablet Take 1 tablet (500 mg total) by mouth daily.   rosuvastatin (CRESTOR) 40 MG tablet Take 1 tablet (40 mg total) by mouth daily.   [DISCONTINUED] ondansetron (ZOFRAN) 8 MG tablet Take 1 tablet (8 mg total) by mouth every 8 (eight) hours as needed for nausea or vomiting.   [DISCONTINUED] predniSONE  (STERAPRED UNI-PAK 21 TAB) 10 MG (21) TBPK tablet Take by mouth.     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   Type 2 diabetes mellitus without complication, without long-term current use of insulin (HCC) 09/23/2016   Spondylolisthesis of lumbar region 08/05/2015    Allergies  Allergen Reactions   Gabapentin Other (See Comments)    Suicidal ideation   Cephalexin Rash    Immunization History  Administered Date(s) Administered   PFIZER(Purple Top)SARS-COV-2 Vaccination 06/01/2019, 06/27/2019    Past Surgical History:  Procedure Laterality Date   APPENDECTOMY     BACK SURGERY     HERNIA REPAIR     TYMPANOPLASTY      Social History   Tobacco Use   Smoking status: Never   Smokeless tobacco: Never  Vaping Use   Vaping status: Never Used  Substance Use Topics   Alcohol use: No   Drug use: Yes    Types: Marijuana    Family History  Problem Relation Age of Onset   Bipolar disorder  Mother    Diabetes Father    Diabetes Paternal Grandmother    Cancer Paternal Grandmother    Diabetes Paternal Grandfather    Cancer Paternal Grandfather    Heart attack Paternal Grandfather         04/08/2023    3:49 PM 03/25/2023   11:09 AM 08/13/2022    1:31 PM 05/20/2022    3:20 PM  GAD 7 : Generalized Anxiety Score  Nervous, Anxious, on Edge 3 3 3 3   Control/stop worrying 3 3 3 3   Worry too much - different things 3 3 3 3   Trouble relaxing 3 3 3 3   Restless 3 3 3 3   Easily annoyed or irritable 3 3 3 3   Afraid - awful might happen 3 3 3 3   Total GAD 7 Score 21 21 21 21    Anxiety Difficulty Somewhat difficult Somewhat difficult Somewhat difficult Extremely difficult       04/08/2023    3:49 PM 03/25/2023   11:09 AM 08/13/2022    1:31 PM  Depression screen PHQ 2/9  Decreased Interest  1 1  Down, Depressed, Hopeless 0 1 1  PHQ - 2 Score 0 2 2  Altered sleeping  3 3  Tired, decreased energy  3 3  Change in appetite  0 1  Feeling bad or failure about yourself   2 3  Trouble concentrating  2 3  Moving slowly or fidgety/restless  2 3  Suicidal thoughts  0 0  PHQ-9 Score  14 18  Difficult doing work/chores  Somewhat difficult Somewhat difficult    BP Readings from Last 3 Encounters:  04/08/23 108/86  03/25/23 102/70  08/13/22 112/82    Wt Readings from Last 3 Encounters:  04/08/23 187 lb (84.8 kg)  03/25/23 192 lb (87.1 kg)  08/13/22 177 lb (80.3 kg)    BP 108/86   Pulse 77   Temp 97.8 F (36.6 C)   Ht 5\' 10"  (1.778 m)   Wt 187 lb (84.8 kg)   SpO2 96%   BMI 26.83 kg/m   Physical Exam Vitals and nursing note reviewed.  Constitutional:      Appearance: Normal appearance.  Cardiovascular:     Rate and Rhythm: Normal rate.  Pulmonary:     Effort: Pulmonary effort is normal.  Abdominal:     General: There is no distension.  Musculoskeletal:        General: Normal range of motion.  Skin:    General: Skin is warm and dry.  Neurological:     Mental Status: He is alert and oriented to person, place, and time.     Gait: Gait is intact.  Psychiatric:        Mood and Affect: Mood and affect normal.     Recent Labs     Component Value Date/Time   NA 137 04/28/2022 0000   NA 137 02/27/2014 1650   K 3.9 04/28/2022 0000   K 3.8 02/27/2014 1650   CL 103 04/28/2022 0000   CL 104 02/27/2014 1650   CO2 29 (A) 04/28/2022 0000   CO2 24 02/27/2014 1650   GLUCOSE 160 (H) 11/03/2021 1405   GLUCOSE 110 (H) 02/27/2014 1650   BUN 10 04/28/2022 0000   BUN 16 02/27/2014 1650   CREATININE 0.6 04/28/2022 0000   CREATININE 0.61 11/03/2021 1405    CREATININE 1.18 02/27/2014 1650   CALCIUM 9.4 04/28/2022 0000   CALCIUM 9.3 02/27/2014 1650   PROT 7.8 11/03/2021 1405  PROT 7.5 02/27/2014 1650   ALBUMIN 4.4 04/28/2022 0000   ALBUMIN 4.1 02/27/2014 1650   AST 16 04/28/2022 0000   AST 17 02/27/2014 1650   ALT 21 04/28/2022 0000   ALT 28 02/27/2014 1650   ALKPHOS 75 04/28/2022 0000   ALKPHOS 63 02/27/2014 1650   BILITOT 0.9 11/03/2021 1405   BILITOT 0.8 02/27/2014 1650   GFRNONAA >60 11/03/2021 1405   GFRNONAA >60 02/27/2014 1650   GFRNONAA >60 08/15/2011 0854   GFRAA >60 08/06/2015 0404   GFRAA >60 02/27/2014 1650   GFRAA >60 08/15/2011 0854    Lab Results  Component Value Date   WBC 8.1 04/28/2022   HGB 15.0 04/28/2022   HCT 43 04/28/2022   MCV 87.5 11/03/2021   PLT 236 04/28/2022   Lab Results  Component Value Date   HGBA1C 8.9 (A) 04/08/2023   Lab Results  Component Value Date   CHOL 267 (A) 11/06/2021   HDL 41 11/06/2021   LDLCALC 181 11/06/2021   TRIG 224 (A) 11/06/2021   Lab Results  Component Value Date   TSH 3.45 11/06/2021   TSH 4.70 11/06/2021   Lab Results  Component Value Date   HGBA1C 8.9 (A) 04/08/2023   HGBA1C 8.3 04/28/2022     Assessment and Plan:  ADDENDUM - chose Vraylar instead of lithium for suspected bipolar disorder.   Type 2 diabetes mellitus without complication, without long-term current use of insulin (HCC) Assessment & Plan: A1c 8.9% today, up from 8.3%, reflecting inadequate glycemic control.  Plan to increase metformin, but since we are adding lithium today, will hold off on dose adjustment at this time.  Short interval follow-up in about a month where we will likely increase metformin.  Recommend diet low in simple carbohydrates such as white starches (bread, pasta, rice) and refined sugar found in desserts and sweetened beverages including juice, sweet tea, and soda.    Orders: -     POCT glycosylated hemoglobin (Hb A1C)  Bipolar disorder with depression  (HCC) Assessment & Plan: Existing diagnosis of bipolar disorder, possibly with some borderline personality disorder as well.  Will try lithium at initial dose of 300 mg 3 times daily, check levels in 1 month and assess efficacy.  Patient informed that ultimately we need behavioral health involved  Orders: -     Lithium Carbonate; Take 1 tablet (300 mg total) by mouth 3 (three) times daily.  Dispense: 90 tablet; Refill: 1  Marijuana use, continuous Assessment & Plan: Again reviewed the potential for regular daily use to cause marijuana induced mood disorder.  Encouraged reduction/cessation.  Derrick Joyce concerned that this seems to be the only thing that helps his anxiety.      Return in about 4 weeks (around 05/06/2023) for OV f/u chronic conditions.    Cody Das, PA-C, DMSc, Nutritionist [facility] Primary Care and Sports Medicine [facility] [phone]

## 2023-04-08 NOTE — Assessment & Plan Note (Addendum)
Existing diagnosis of bipolar disorder, possibly with some borderline personality disorder as well.  Will try lithium at initial dose of 300 mg 3 times daily, check levels in 1 month and assess efficacy.  Patient informed that ultimately we need behavioral health involved

## 2023-04-08 NOTE — Telephone Encounter (Signed)
Called pt left VM that pt has 2 appts scheduled for today. Calling to see which appt was correct.  KP

## 2023-04-08 NOTE — Telephone Encounter (Signed)
Call returned, appt cancelled.

## 2023-04-08 NOTE — Assessment & Plan Note (Signed)
Again reviewed the potential for regular daily use to cause marijuana induced mood disorder.  Encouraged reduction/cessation.  Derrick Joyce concerned that this seems to be the only thing that helps his anxiety.

## 2023-04-08 NOTE — Assessment & Plan Note (Signed)
A1c 8.9% today, up from 8.3%, reflecting inadequate glycemic control.  Plan to increase metformin, but since we are adding lithium today, will hold off on dose adjustment at this time.  Short interval follow-up in about a month where we will likely increase metformin.  Recommend diet low in simple carbohydrates such as white starches (bread, pasta, rice) and refined sugar found in desserts and sweetened beverages including juice, sweet tea, and soda.

## 2023-04-09 ENCOUNTER — Other Ambulatory Visit: Payer: Self-pay | Admitting: Physician Assistant

## 2023-04-09 MED ORDER — CARIPRAZINE HCL 3 MG PO CAPS: 3.0000 mg | ORAL_CAPSULE | Freq: Every day | ORAL | Status: DC

## 2023-04-09 MED ORDER — CARIPRAZINE HCL 1.5 MG PO CAPS: 1.5000 mg | ORAL_CAPSULE | Freq: Every day | ORAL | Status: DC

## 2023-04-09 NOTE — Progress Notes (Signed)
Change to yesterday's plan. Patient has not yet picked up lithium. Patient advised not to purchase lithium. Will instead set samples of Vraylar (1.5 mg x7 and also 3 mg x7) at front desk for pickup by Derrick Joyce or his wife. Spoke to patient via phone, verbalizes understanding.

## 2023-04-19 ENCOUNTER — Telehealth: Payer: Self-pay | Admitting: Physician Assistant

## 2023-04-19 NOTE — Telephone Encounter (Signed)
Pt wife is calling to report that the patient has 1 left of cariprazine (VRAYLAR) 3 MG capsule [[id number]] . Reporting the medication went well. Please advise if Jesusita Oka would like the patient to continue. [id number] - [place], [place] - [address] AT [facility] [address] Phone: [phone]  Fax: [phone]

## 2023-04-20 ENCOUNTER — Other Ambulatory Visit: Payer: Self-pay | Admitting: Physician Assistant

## 2023-04-20 ENCOUNTER — Telehealth: Payer: Self-pay | Admitting: Physician Assistant

## 2023-04-20 ENCOUNTER — Other Ambulatory Visit: Payer: Self-pay

## 2023-04-20 DIAGNOSIS — F319 Bipolar disorder, unspecified: Secondary | ICD-10-CM

## 2023-04-20 MED ORDER — CARIPRAZINE HCL 3 MG PO CAPS: 3.0000 mg | ORAL_CAPSULE | Freq: Every day | ORAL | 1 refills | Status: DC

## 2023-04-20 NOTE — Telephone Encounter (Signed)
Called pt left VM to call back. Calling to see if pts insurance card is the same as last year.  KP

## 2023-04-20 NOTE — Telephone Encounter (Signed)
Pt called stating he needs a prescription sent in for Vraylar 3 MG. 7 tablets was sent in on 04/09/23.  KP

## 2023-04-20 NOTE — Telephone Encounter (Signed)
PA completed waiting for insurance approval.  Key: [id number]  KP

## 2023-04-20 NOTE — Telephone Encounter (Signed)
Initiating prior auth for Vraylar for bipolar depression, continuation of therapy. Sent rx to [facility] specialty pharmacy

## 2023-04-20 NOTE — Telephone Encounter (Signed)
Called pt left VM to call back. Need to now if insurance card is the same as last year.  KP

## 2023-04-22 DIAGNOSIS — M25512 Pain in left shoulder: Secondary | ICD-10-CM | POA: Diagnosis not present

## 2023-04-22 DIAGNOSIS — M50123 Cervical disc disorder at C6-C7 level with radiculopathy: Secondary | ICD-10-CM | POA: Diagnosis not present

## 2023-04-27 ENCOUNTER — Telehealth: Payer: Self-pay

## 2023-04-27 NOTE — Telephone Encounter (Signed)
Tried to complete PA on [email or web address] for Vraylar 3 mg.  PA said "not needed".  - Coye Dawood

## 2023-05-03 NOTE — Telephone Encounter (Signed)
 Requested medication (s) are due for refill today: Yes}  Requested medication (s) are on the active medication list: Yes  Last refill:  04/20/23  Future visit scheduled: Yes  Notes to clinic:  Unable to refill due to no refill protocol for this medication.      Requested Prescriptions  Pending Prescriptions Disp Refills   cariprazine (VRAYLAR) 3 MG capsule 90 capsule 1    Sig: Take 1 capsule (3 mg total) by mouth daily.     Off-Protocol Failed - 05/03/2023  4:02 PM      Failed - Medication not assigned to a protocol, review manually.      Passed - Valid encounter within last 12 months    Recent Outpatient Visits           3 weeks ago Type 2 diabetes mellitus without complication, without long-term current use of insulin (HCC)   [place] [facility] at [facility], Mel[place] Alar, [place]   1 month ago Acute pain of left shoulder   [facility] Primary Care & Sports Medicine at [facility], [place], [place]   8 months ago Vomiting without nausea, unspecified vomiting type   [facility] Care & Sports Medicine at [facility], Melton Alar, [place]   11 months ago Generalized anxiety disorder   [facility] Care & Sports Medicine at [facility], Melton Alar, [place]       Future Appointments             In 3 days Mordecai Maes, Melton Alar, [place] [facility] Primary Care & Sports Medicine at [facility], [facility]

## 2023-05-03 NOTE — Telephone Encounter (Signed)
 Medication Refill -  Most Recent Primary Care Visit:  Provider: Remo Lipps  Department: [facility] [place]  Visit Type: OFFICE VISIT  Date: 04/08/2023  Medication: cariprazine (VRAYLAR) 3 MG capsule   Has the patient contacted their pharmacy? Yes  Medication was originally sent to [facility] in [place]. Pt states that they said they were going to mail the medication to him and he still does not have his medication, has been waiting on it since 04/20/2023.  Pt want the medication sent to his local pharmacy. Pt is unclear why the medication was sent to [facility] unless it needed a Prior Authorization.   Is this the correct pharmacy for this prescription? Yes  This is the patient's preferred pharmacy:  [id number], [place] - [address] AT [facility] OF [address] [place] [address] Phone: [phone] Fax: [phone]    Has the prescription been filled recently? Yes  Is the patient out of the medication? Yes  Has the patient been seen for an appointment in the last year OR does the patient have an upcoming appointment? Yes  Can we respond through MyChart? Yes  Pt would like a call.   Agent: Please be advised that Rx refills may take up to 3 business days. We ask that you follow-up with your pharmacy.

## 2023-05-06 ENCOUNTER — Ambulatory Visit: Payer: Self-pay | Admitting: Physician Assistant

## 2023-05-28 ENCOUNTER — Ambulatory Visit
Admission: EM | Admit: 2023-05-28 | Discharge: 2023-05-28 | Disposition: A | Discharge status: Home / Self Care | Attending: Emergency Medicine | Admitting: Emergency Medicine

## 2023-05-28 ENCOUNTER — Other Ambulatory Visit: Payer: Self-pay | Admitting: Physician Assistant

## 2023-05-28 ENCOUNTER — Ambulatory Visit: Admitting: Physician Assistant

## 2023-05-28 ENCOUNTER — Encounter: Payer: Self-pay | Admitting: Emergency Medicine

## 2023-05-28 ENCOUNTER — Encounter: Payer: Self-pay | Admitting: Physician Assistant

## 2023-05-28 VITALS — BP 147/90 | HR 82 | Ht 70.0 in | Wt 182.6 lb

## 2023-05-28 DIAGNOSIS — E119 Type 2 diabetes mellitus without complications: Secondary | ICD-10-CM

## 2023-05-28 DIAGNOSIS — L02412 Cutaneous abscess of left axilla: Secondary | ICD-10-CM | POA: Diagnosis not present

## 2023-05-28 DIAGNOSIS — L02419 Cutaneous abscess of limb, unspecified: Secondary | ICD-10-CM

## 2023-05-28 DIAGNOSIS — L03112 Cellulitis of left axilla: Secondary | ICD-10-CM | POA: Diagnosis not present

## 2023-05-28 MED ORDER — TRIPLE ANTIBIOTIC 3.5-400-5000 EX OINT: 1.0000 | TOPICAL_OINTMENT | Freq: Once | CUTANEOUS | Status: DC

## 2023-05-28 MED ORDER — LIDOCAINE HCL (PF) 1 % IJ SOLN: 5.0000 mL | Freq: Once | INTRAMUSCULAR | Status: DC

## 2023-05-28 MED ORDER — DOXYCYCLINE HYCLATE 100 MG PO CAPS: 100.0000 mg | ORAL_CAPSULE | Freq: Two times a day (BID) | ORAL | 0 refills | Status: DC

## 2023-05-28 NOTE — Telephone Encounter (Signed)
 Please review

## 2023-05-28 NOTE — Telephone Encounter (Signed)
 Requested medications are due for refill today.  yes  Requested medications are on the active medications list.  yes  Last refill. 11/25/2022 #90 1 rf  Future visit scheduled.   yes  Notes to clinic.  Labs are expired.    Requested Prescriptions  Pending Prescriptions Disp Refills   metFORMIN (GLUCOPHAGE-XR) 500 MG 24 hr tablet [Pharmacy Med Name: METFORMIN ER 500MG  24HR TABS] 90 tablet 1    Sig: TAKE 1 TABLET(500 MG) BY MOUTH DAILY     Endocrinology:  Diabetes - Biguanides Failed - 05/28/2023  4:11 PM      Failed - Cr in normal range and within 360 days    Creatinine  Date Value Ref Range Status  04/28/2022 0.6 0.6 - 1.3 Final  02/27/2014 1.18 0.60 - 1.30 mg/dL Final   Creatinine, Ser  Date Value Ref Range Status  11/03/2021 0.61 0.61 - 1.24 mg/dL Final   Creatinine, Urine  Date Value Ref Range Status  11/06/2021 103.2  Final         Failed - HBA1C is between 0 and 7.9 and within 180 days    Hemoglobin A1C  Date Value Ref Range Status  04/08/2023 8.9 (A) 4.0 - 5.6 % Final  04/28/2022 8.3  Final         Failed - eGFR in normal range and within 360 days    EGFR (African American)  Date Value Ref Range Status  02/27/2014 >60 >59mL/min Final  08/15/2011 >60  Final   GFR calc Af Amer  Date Value Ref Range Status  08/06/2015 >60 >60 mL/min Final    Comment:    (NOTE) The eGFR has been calculated using the CKD EPI equation. This calculation has not been validated in all clinical situations. eGFR's persistently <60 mL/min signify possible Chronic Kidney Disease.    EGFR (Non-African Amer.)  Date Value Ref Range Status  02/27/2014 >60 >67mL/min Final    Comment:    eGFR values <87mL/min/1.73 m2 may be an indication of chronic kidney disease (CKD). Calculated eGFR, using the MRDR Study equation, is useful in  patients with stable renal function. The eGFR calculation will not be reliable in acutely ill patients when serum creatinine is changing rapidly. It is  not useful in patients on dialysis. The eGFR calculation may not be applicable to patients at the low and high extremes of body sizes, pregnant women, and vegetarians.   08/15/2011 >60  Final    Comment:    eGFR values <32mL/min/1.73 m2 may be an indication of chronic kidney disease (CKD). Calculated eGFR is useful in patients with stable renal function. The eGFR calculation will not be reliable in acutely ill patients when serum creatinine is changing rapidly. It is not useful in  patients on dialysis. The eGFR calculation may not be applicable to patients at the low and high extremes of body sizes, pregnant women, and vegetarians.    GFR, Estimated  Date Value Ref Range Status  11/03/2021 >60 >60 mL/min Final    Comment:    (NOTE) Calculated using the CKD-EPI Creatinine Equation (2021)    eGFR  Date Value Ref Range Status  04/28/2022 118  Final         Failed - B12 Level in normal range and within 720 days    No results found for: "VITAMINB12"       Failed - CBC within normal limits and completed in the last 12 months    WBC  Date Value Ref Range Status  04/28/2022 8.1  Final  11/03/2021 8.8 4.0 - 10.5 K/uL Final   RBC  Date Value Ref Range Status  04/28/2022 4.94 3.87 - 5.11 Final   Hemoglobin  Date Value Ref Range Status  04/28/2022 15.0 13.5 - 17.5 Final   HGB  Date Value Ref Range Status  02/27/2014 16.5 13.0 - 18.0 g/dL Final   HCT  Date Value Ref Range Status  04/28/2022 43 41 - 53 Final  02/27/2014 49.7 40.0 - 52.0 % Final   MCHC  Date Value Ref Range Status  11/03/2021 33.5 30.0 - 36.0 g/dL Final   [facility]  Date Value Ref Range Status  11/03/2021 29.3 26.0 - 34.0 pg Final   MCV  Date Value Ref Range Status  11/03/2021 87.5 80.0 - 100.0 fL Final  02/27/2014 89 80 - 100 fL Final   No results found for: "PLTCOUNTKUC", "LABPLAT", "POCPLA" RDW  Date Value Ref Range Status  11/03/2021 13.1 11.5 - 15.5 % Final  02/27/2014 13.3 11.5 - 14.5 %  Final         Passed - Valid encounter within last 6 months    Recent Outpatient Visits           1 month ago Type 2 diabetes mellitus without complication, without long-term current use of insulin (HCC)   [place] [facility] at [facility], [place], [place]   2 months ago Acute pain of left shoulder   [facility] at [facility], [place], [place]   9 months ago Vomiting without nausea, unspecified vomiting type   [facility] Primary Care & Sports Medicine at [facility], Melton Alar, [place]   1 year ago Generalized anxiety disorder   [facility] Medicine at [facility], [place] Al[place], [place]       Future Appointments             In 1 month Mordecai Maes, Melton Alar, [place] [facility] Primary Care & Sports Medicine at [facility], [facility]

## 2023-05-28 NOTE — Discharge Instructions (Addendum)
 Take antibiotic as directed(doxycycline), change dressing every day, remove packing in 3 days, if it falls out early: no worries.  Drink plenty of fluids Follow up with PCP in 3 days for wound check,sooner if worse. If you have new or worsening issues(increased redness,streaking,swelling,pain,fever, unable to take antibiotics,go to Er for further evaluation).

## 2023-05-28 NOTE — ED Provider Notes (Signed)
 [facility]    CSN: [id number] Arrival date & time: 05/28/23  1152      History   Chief Complaint Chief Complaint  Patient presents with   Abscess    Left armpit    HPI Derrick Joyce is a 53 y.o. male.   53 year old male pt, Derrick Joyce, presents to urgent care for evaluation of left armpit swelling/knot x 1 week.  Patient states that his wife started pressing on the area and had some pus drainage from site, but now is hurting and worse,  patient denies any fever.  Pt has used boil ease and ibuprofen for pain. Pt sent form PCP office for I/D  The history is provided by the patient. No language interpreter was used.    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 Authorizing Provider  doxycycline (VIBRAMYCIN) 100 MG capsule Take 1 capsule (100 mg total) by  mouth 2 (two) times daily for 5 days. 05/28/23 06/02/23 Yes Leven Hoel, Para March, NP  IBUPROFEN PO Take by mouth daily.   Yes [provider]  cariprazine (VRAYLAR) 3 MG capsule Take 1 capsule (3 mg total) by mouth daily. 04/20/23   Remo Lipps, PA  DULoxetine (CYMBALTA) 60 MG capsule TAKE 1 CAPSULE(60 MG) BY MOUTH DAILY 03/30/23   Remo Lipps, PA  metformin (GLUCOPHAGE-XR) 500 MG 24 hr tablet Take 1 tablet (500 mg total) by mouth daily. 11/25/22   Remo Lipps, PA  rosuvastatin (CRESTOR) 40 MG tablet Take 1 tablet (40 mg total) by mouth daily. 11/25/22   Remo Lipps, PA    Family History Family History  Problem Relation Age of Onset   Bipolar disorder Mother    Diabetes Father    Diabetes Paternal Grandmother    Cancer Paternal Grandmother    Diabetes Paternal Grandfather    Cancer Paternal Grandfather    Heart attack Paternal Grandfather     Social History Social History   Tobacco Use   Smoking status: Never   Smokeless tobacco: Never  Vaping Use   Vaping status: Never Used  Substance Use Topics   Alcohol use: No   Drug use: Yes    Types: Marijuana     Allergies   Gabapentin and Cephalexin   Review of Systems Review of  Systems  Constitutional:  Negative for fever.  Skin:  Positive for color change and wound.  All other systems reviewed and are negative.    Physical Exam Triage Vital Signs ED Triage Vitals  Encounter Vitals Group     BP      Systolic BP Percentile      Diastolic BP Percentile      Pulse      Resp      Temp      Temp src      SpO2      Weight      Height      Head Circumference      Peak Flow      Pain Score      Pain Loc      Pain Education      Exclude from Growth Chart    No data found.  Updated Vital Signs BP (!) 142/90 (BP Location: Right Arm)   Pulse 90   Temp 98.1 F (36.7 C) (Oral)   Resp 15   Ht 5\' 10"  (1.778 m)   Wt 182 lb 8.7 oz (82.8 kg)   SpO2 97%   BMI 26.19 kg/m   Visual  Acuity Right Eye Distance:   Left Eye Distance:   Bilateral Distance:    Right Eye Near:   Left Eye Near:    Bilateral Near:     Physical Exam Vitals and nursing note reviewed.  Skin:    General: Skin is warm.     Capillary Refill: Capillary refill takes less than 2 seconds.     Findings: Erythema and wound present.     Comments: Left axillae ~ 4 cm area of redness,+TTP,  with central purulent drainage noted.   Neurological:     General: No focal deficit present.     Mental Status: He is alert and oriented to person, place, and time.     GCS: GCS eye subscore is 4. GCS verbal subscore is 5. GCS motor subscore is 6.  Psychiatric:        Attention and Perception: Attention normal.        Mood and Affect: Mood normal.        Speech: Speech normal.        Behavior: Behavior is cooperative.      UC Treatments / Results  Labs (all labs ordered are listed, but only abnormal results are displayed) Labs Reviewed - No data to display  EKG   Radiology No results found.  Procedures Incision and Drainage  Date/Time: 05/28/2023 12:30 PM  Performed by: Clancy Gourd, NP Authorized by: Clancy Gourd, NP   Consent:    Consent obtained:  Verbal   Consent given by:  Patient   Risks, benefits, and alternatives were discussed: yes     Risks discussed:  Bleeding, incomplete drainage, pain, damage to other organs and infection   Alternatives discussed:  No treatment Universal protocol:    Procedure explained and questions answered to patient or proxy's satisfaction: yes     Relevant documents present and verified: yes     Test results available : yes     Imaging studies available: yes     Required blood products, implants, devices, and special equipment available: yes     Site/side marked: yes     Immediately prior to procedure, a time out was called: yes     Patient identity confirmed:  Verbally with patient, arm band and hospital-assigned identification  number Location:  Type:  Abscess   Size:  4 cm   Location:  Upper extremity   Upper extremity location: left axillae. Pre-procedure details:    Skin preparation:  Betadine Anesthesia:    Anesthesia method:  Local infiltration   Local anesthetic:  Lidocaine 1% w/o epi Procedure type:    Complexity:  Complex Procedure details:    Incision types:  Single straight   Incision depth:  Subcutaneous   Wound management:  Probed and deloculated and irrigated with saline   Drainage:  Purulent   Drainage amount:  Moderate   Wound treatment:  Wound left open   Packing materials:  1/2 in iodoform gauze Post-procedure details:    Procedure completion:  Tolerated well, no immediate complications Comments:     Telfa dressing applied  (including critical care time)  Medications Ordered in UC Medications  lidocaine (PF) (XYLOCAINE) 1 % injection 5 mL (has no administration in time range)  neomycin-bacitracin-polymyxin [id number] OINT 1 Application (has no administration in time range)    Initial Impression / Assessment and Plan / UC Course  I have reviewed the triage vital signs and the nursing notes.  Pertinent labs & imaging results that were available during my care of the patient were reviewed by me and considered in my medical decision making (see chart for details).    Discussed exam findings and plan of care with patient, doxycycline scripted, strict go to ER precautions given.   Patient verbalized understanding to this provider.  Ddx: Abscess left axillae with I/D, cellulitis,folliculitis,insect bite,allergic reaction Final Clinical Impressions(s) / UC Diagnoses   Final diagnoses:  Abscess of left axilla  Cellulitis of left axilla     Discharge Instructions      Take antibiotic as directed(doxycycline), change dressing every day, remove packing in 3 days, if it falls out early: no worries.  Drink plenty of fluids Follow up with PCP in 3 days for wound check,sooner if  worse. If you have new or worsening issues(increased redness,streaking,swelling,pain,fever, unable to take antibiotics,go to Er for further evaluation).       ED Prescriptions     Medication Sig Dispense Auth. Provider   doxycycline (VIBRAMYCIN) 100 MG capsule Take 1 capsule (100 mg total) by mouth 2 (two) times daily for 5 days. 10 capsule Avenell Sellers, Para March, NP      PDMP not reviewed this encounter.   Clancy Gourd, NP 05/28/23 1315

## 2023-05-28 NOTE — Progress Notes (Signed)
 Date:  05/28/2023   Name:  Derrick Joyce   DOB:  Aug 27, 1970   MRN:  [id number]   Chief Complaint: Cyst (Patient present due to knot located under left axillary located there for over a year. He reports it started as a very small knot and has now increased in size by 4-5 times. Associated with redness, pain(7-8/10) and discharge. Changes started in the last week or two. Pt reports having a bandage over and boil-ease on it. Patient report taking ibuprofen for pain that does provide relief for at least 8 hours depending on how active he is that day. )  HPI Derrick Joyce presents today for evaluation of possible left axillary abscess which has significantly worsened over the last 10 days, states his wife tried to pop it and some purulent material came out, but now it is enlarged and very painful.  No fever.  Last A1c 8.9%, but he has been mindful to work on lifestyle changes and reducing carb intake in the diet, due for recheck next month.   Medication list has been reviewed and updated.  Current Meds  Medication Sig   DULoxetine (CYMBALTA) 60 MG capsule TAKE 1 CAPSULE(60 MG) BY MOUTH DAILY   IBUPROFEN PO Take by mouth daily.   metformin (GLUCOPHAGE-XR) 500 MG 24 hr tablet Take 1 tablet (500 mg total) by mouth daily.   rosuvastatin (CRESTOR) 40 MG tablet Take 1 tablet (40 mg total) by mouth daily.     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   Type 2 diabetes mellitus without complication, without long-term current use of insulin (HCC) 09/23/2016   Spondylolisthesis of lumbar region  08/05/2015    Allergies  Allergen Reactions   Gabapentin Other (See Comments)    Suicidal ideation   Cephalexin Rash    Immunization History  Administered Date(s) Administered   PFIZER(Purple Top)SARS-COV-2 Vaccination 06/01/2019, 06/27/2019    Past Surgical History:  Procedure Laterality Date   APPENDECTOMY     BACK SURGERY     HERNIA REPAIR     TYMPANOPLASTY      Social History   Tobacco Use   Smoking status: Never   Smokeless tobacco: Never  Vaping Use   Vaping status: Never Used  Substance Use Topics   Alcohol use: No   Drug use: Yes    Types: Marijuana    Family History  Problem Relation Age of Onset   Bipolar disorder Mother    Diabetes Father    Diabetes Paternal Grandmother    Cancer Paternal Grandmother    Diabetes Paternal Grandfather    Cancer Paternal Grandfather    Heart attack Paternal Grandfather         05/28/2023   11:27 AM 04/08/2023    3:49 PM 03/25/2023   11:09 AM 08/13/2022    1:31 PM  GAD 7 : Generalized Anxiety Score  Nervous, Anxious, on Edge 3 3 3 3   Control/stop worrying 3 3 3 3   Worry too much - different things 3 3 3 3   Trouble relaxing 3 3 3 3   Restless 3 3 3 3   Easily annoyed or irritable  3 3 3 3   Afraid - awful might happen 0 3 3 3   Total GAD 7 Score 18 21 21 21   Anxiety Difficulty Somewhat difficult Somewhat difficult Somewhat difficult Somewhat difficult       05/28/2023   11:27 AM 04/08/2023    3:49 PM 03/25/2023   11:09 AM  Depression screen PHQ 2/9  Decreased Interest 1  1  Down, Depressed, Hopeless 1 0 1  PHQ - 2 Score 2 0 2  Altered sleeping 2  3  Tired, decreased energy 2  3  Change in appetite 2  0  Feeling bad or failure about yourself  3  2  Trouble concentrating 3  2  Moving slowly or fidgety/restless 3  2  Suicidal thoughts 0  0  PHQ-9 Score 17  14  Difficult doing work/chores Somewhat difficult  Somewhat difficult    BP Readings from Last 3 Encounters:  05/28/23 (!) 147/90  04/08/23 108/86   03/25/23 102/70    Wt Readings from Last 3 Encounters:  05/28/23 182 lb 9.6 oz (82.8 kg)  04/08/23 187 lb (84.8 kg)  03/25/23 192 lb (87.1 kg)    BP (!) 147/90 (BP Location: Right Arm, Patient Position: Sitting, Cuff Size: Normal)   Pulse 82   Ht 5\' 10"  (1.778 m)   Wt 182 lb 9.6 oz (82.8 kg)   SpO2 97%   BMI 26.20 kg/m   Physical Exam Vitals and nursing note reviewed.  Constitutional:      Appearance: Normal appearance.  Cardiovascular:     Rate and Rhythm: Normal rate.  Pulmonary:     Effort: Pulmonary effort is normal.  Abdominal:     General: There is no distension.  Musculoskeletal:        General: Normal range of motion.  Skin:    General: Skin is warm and dry.     Comments: 4 cm erythematous very tender abscess of the left axillary region, specifically located at the medial aspect of the proximal left upper extremity.  Neurological:     Mental Status: He is alert and oriented to person, place, and time.     Gait: Gait is intact.  Psychiatric:        Mood and Affect: Mood and affect normal.     Recent Labs     Component Value Date/Time   NA 137 04/28/2022 0000   NA 137 02/27/2014 1650   K 3.9 04/28/2022 0000   K 3.8 02/27/2014 1650   CL 103 04/28/2022 0000   CL 104 02/27/2014 1650   CO2 29 (A) 04/28/2022 0000   CO2 24 02/27/2014 1650   GLUCOSE 160 (H) 11/03/2021 1405   GLUCOSE 110 (H) 02/27/2014 1650   BUN 10 04/28/2022 0000   BUN 16 02/27/2014 1650   CREATININE 0.6 04/28/2022 0000   CREATININE 0.61 11/03/2021 1405   CREATININE 1.18 02/27/2014 1650   CALCIUM 9.4 04/28/2022 0000   CALCIUM 9.3 02/27/2014 1650   PROT 7.8 11/03/2021 1405   PROT 7.5 02/27/2014 1650   ALBUMIN 4.4 04/28/2022 0000   ALBUMIN 4.1 02/27/2014 1650   AST 16 04/28/2022 0000   AST 17 02/27/2014 1650   ALT 21 04/28/2022 0000   ALT 28 02/27/2014 1650   ALKPHOS 75 04/28/2022 0000   ALKPHOS 63 02/27/2014 1650   BILITOT 0.9 11/03/2021 1405   BILITOT 0.8 02/27/2014 1650    GFRNONAA >60 11/03/2021 1405   GFRNONAA >60 02/27/2014 1650   GFRNONAA >60 08/15/2011 2956  GFRAA >60 08/06/2015 0404   GFRAA >60 02/27/2014 1650   GFRAA >60 08/15/2011 0854    Lab Results  Component Value Date   WBC 8.1 04/28/2022   HGB 15.0 04/28/2022   HCT 43 04/28/2022   MCV 87.5 11/03/2021   PLT 236 04/28/2022   Lab Results  Component Value Date   HGBA1C 8.9 (A) 04/08/2023   Lab Results  Component Value Date   CHOL 267 (A) 11/06/2021   HDL 41 11/06/2021   LDLCALC 181 11/06/2021   TRIG 224 (A) 11/06/2021   Lab Results  Component Value Date   TSH 3.45 11/06/2021   TSH 4.70 11/06/2021     Assessment and Plan:  1. Abscess of axillary region (Primary) Unable to perform incision and drainage in his office at this time, advised to proceed downstairs to our urgent care for I&D.    Return in about 4 weeks (around 06/25/2023) for OV f/u chronic conditions.    Alvester Morin, PA-C, DMSc, Nutritionist [facility] Primary Care and Sports Medicine [facility] [phone]

## 2023-05-28 NOTE — ED Triage Notes (Signed)
 Patient reports abscess that started to get red and bigger over a week.  Patient states that his wife started pressing on it and reports some drainage from the site.  Patient denies fevers.

## 2023-06-01 ENCOUNTER — Encounter: Payer: Self-pay | Admitting: Physician Assistant

## 2023-06-01 ENCOUNTER — Other Ambulatory Visit: Payer: Self-pay

## 2023-06-01 ENCOUNTER — Ambulatory Visit: Admitting: Physician Assistant

## 2023-06-01 VITALS — BP 108/64 | HR 73 | Ht 70.0 in | Wt 188.2 lb

## 2023-06-01 DIAGNOSIS — L02419 Cutaneous abscess of limb, unspecified: Secondary | ICD-10-CM | POA: Diagnosis not present

## 2023-06-01 MED ORDER — DOXYCYCLINE HYCLATE 100 MG PO CAPS: 100.0000 mg | ORAL_CAPSULE | Freq: Two times a day (BID) | ORAL | 0 refills | Status: AC

## 2023-06-01 NOTE — Progress Notes (Signed)
 Date:  06/01/2023   Name:  Derrick Joyce   DOB:  February 10, 1971   MRN:  [id number]   Chief Complaint: Abscess (Patient seen at [facility] on 05/28/22. Pt was advised to take antibiotic as directed (doxycycline), change dressing every day, remove packing in 3 days and drink plenty of fluids. Pt reports having one more abx when going home , packing fell out on its own. Reports it looks better than it did as it is not as swollen.)  Abscess   Derrick Joyce presents for follow-up on abscess of left axilla/arm s/p L accompanied by his pressure.  He was urgency last time 223 I&D at urgent care 05/28/2023.  He is nearly done with his prescription for doxycycline, states that it seems the abscess has improved, somewhat smaller, no longer painful, but still spontaneously draining yellowish discharge especially in the morning.  The abscess packing spontaneously fell out after a couple days. Overall he feels well and has been following instructions.    Medication list has been reviewed and updated.  Current Meds  Medication Sig   cariprazine (VRAYLAR) 3 MG capsule Take 1 capsule (3 mg total) by mouth daily.   DULoxetine (CYMBALTA) 60 MG capsule TAKE 1 CAPSULE(60 MG) BY MOUTH DAILY   IBUPROFEN PO Take by mouth daily.   metFORMIN (GLUCOPHAGE-XR) 500 MG 24 hr tablet TAKE 1 TABLET(500 MG) BY MOUTH DAILY   rosuvastatin (CRESTOR) 40 MG tablet Take 1 tablet (40 mg total) by mouth daily.   [DISCONTINUED] doxycycline (VIBRAMYCIN) 100 MG capsule Take 1 capsule (100 mg total) by mouth 2 (two) times daily for 5 days.     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   Type 2 diabetes mellitus without complication, without long-term current use of insulin (HCC) 09/23/2016   Spondylolisthesis of lumbar region 08/05/2015    Allergies  Allergen Reactions   Gabapentin Other (See Comments)    Suicidal ideation   Cephalexin Rash    Immunization History  Administered Date(s) Administered   PFIZER(Purple Top)SARS-COV-2 Vaccination 06/01/2019, 06/27/2019    Past Surgical History:  Procedure Laterality Date   APPENDECTOMY     BACK SURGERY     HERNIA REPAIR     TYMPANOPLASTY      Social History   Tobacco Use   Smoking status: Never   Smokeless tobacco: Never  Vaping Use   Vaping status: Never Used  Substance Use Topics   Alcohol use: No   Drug use: Yes    Types: Marijuana    Family History  Problem Relation Age of Onset   Bipolar disorder Mother    Diabetes Father    Diabetes Paternal Grandmother    Cancer Paternal Grandmother    Diabetes Paternal Grandfather    Cancer Paternal Grandfather    Heart attack Paternal Grandfather         06/01/2023    3:07 PM 05/28/2023   11:27 AM 04/08/2023    3:49 PM 03/25/2023   11:09 AM  GAD 7 : Generalized Anxiety Score  Nervous, Anxious,  on Edge 2 3 3 3   Control/stop worrying 1 3 3 3   Worry too much - different things 1 3 3 3   Trouble relaxing 0 3 3 3   Restless 1 3 3 3   Easily annoyed or irritable 2 3 3 3   Afraid - awful might happen 0 0 3 3  Total GAD 7 Score 7 18 21 21   Anxiety Difficulty Not difficult at all Somewhat difficult Somewhat difficult Somewhat difficult       06/01/2023    3:06 PM 05/28/2023   11:27 AM 04/08/2023    3:49 PM  Depression screen PHQ 2/9  Decreased Interest 1 1   Down, Depressed, Hopeless 1 1 0  PHQ - 2 Score 2 2 0  Altered sleeping 2 2   Tired, decreased energy 1 2   Change in appetite 1 2   Feeling bad or failure about yourself  1  3   Trouble concentrating 1 3   Moving slowly or fidgety/restless 0 3   Suicidal thoughts 0 0   PHQ-9 Score 8 17   Difficult doing work/chores Not difficult at all Somewhat difficult     BP Readings from Last 3 Encounters:  06/01/23 108/64  05/28/23 (!) 142/90  05/28/23 (!) 147/90    Wt Readings from Last 3 Encounters:  06/01/23 188 lb 3.2 oz (85.4 kg)  05/28/23 182 lb 8.7 oz (82.8 kg)  05/28/23 182 lb 9.6 oz (82.8 kg)    BP 108/64 (BP Location: Right Arm, Patient Position: Sitting, Cuff Size: Normal)   Pulse 73   Ht 5\' 10"  (1.778 m)   Wt 188 lb 3.2 oz (85.4 kg)   SpO2 97%   BMI 27.00 kg/m   Physical Exam Vitals and nursing note reviewed.  Constitutional:      Appearance: Normal appearance.  Cardiovascular:     Rate and Rhythm: Normal rate.  Pulmonary:     Effort: Pulmonary effort is normal.  Abdominal:     General: There is no distension.  Musculoskeletal:        General: Normal range of motion.  Skin:    General: Skin is warm and dry.     Comments: 3 cm erythematous nontender healing abscess of the left axillary region, specifically located at the medial aspect of the proximal left upper extremity.  Neurological:     Mental Status: He is alert and oriented to person, place, and time.     Gait: Gait is intact.  Psychiatric:        Mood and Affect: Mood and affect normal.     Recent Labs     Component Value Date/Time   NA 137 04/28/2022 0000   NA 137 02/27/2014 1650   K 3.9 04/28/2022 0000   K 3.8 02/27/2014 1650   CL 103 04/28/2022 0000   CL 104 02/27/2014 1650   CO2 29 (A) 04/28/2022 0000   CO2 24 02/27/2014 1650   GLUCOSE 160 (H) 11/03/2021 1405   GLUCOSE 110 (H) 02/27/2014 1650   BUN 10 04/28/2022 0000   BUN 16 02/27/2014 1650   CREATININE 0.6 04/28/2022 0000   CREATININE 0.61 11/03/2021 1405   CREATININE 1.18 02/27/2014 1650   CALCIUM 9.4 04/28/2022 0000   CALCIUM 9.3 02/27/2014 1650   PROT 7.8 11/03/2021 1405   PROT 7.5 02/27/2014 1650    ALBUMIN 4.4 04/28/2022 0000   ALBUMIN 4.1 02/27/2014 1650   AST 16 04/28/2022 0000   AST 17 02/27/2014 1650   ALT 21 04/28/2022  0000   ALT 28 02/27/2014 1650   ALKPHOS 75 04/28/2022 0000   ALKPHOS 63 02/27/2014 1650   BILITOT 0.9 11/03/2021 1405   BILITOT 0.8 02/27/2014 1650   GFRNONAA >60 11/03/2021 1405   GFRNONAA >60 02/27/2014 1650   GFRNONAA >60 08/15/2011 0854   GFRAA >60 08/06/2015 0404   GFRAA >60 02/27/2014 1650   GFRAA >60 08/15/2011 0854    Lab Results  Component Value Date   WBC 8.1 04/28/2022   HGB 15.0 04/28/2022   HCT 43 04/28/2022   MCV 87.5 11/03/2021   PLT 236 04/28/2022   Lab Results  Component Value Date   HGBA1C 8.9 (A) 04/08/2023   Lab Results  Component Value Date   CHOL 267 (A) 11/06/2021   HDL 41 11/06/2021   LDLCALC 181 11/06/2021   TRIG 224 (A) 11/06/2021   Lab Results  Component Value Date   TSH 3.45 11/06/2021   TSH 4.70 11/06/2021     Assessment and Plan:  1. Abscess of axillary region (Primary) Extend doxy rx by a few days. Continue wound care. Return to UC if recurrence.   - doxycycline (VIBRAMYCIN) 100 MG capsule; Take 1 capsule (100 mg total) by mouth 2 (two) times daily for 2 days.  Dispense: 4 capsule; Refill: 0    No follow-ups on file.    Alvester Morin, PA-C, DMSc, Nutritionist [facility] Primary Care and Sports Medicine [facility] [phone]

## 2023-06-22 ENCOUNTER — Other Ambulatory Visit: Payer: Self-pay | Admitting: Physician Assistant

## 2023-06-26 ENCOUNTER — Other Ambulatory Visit: Payer: Self-pay | Admitting: Physician Assistant

## 2023-06-28 ENCOUNTER — Ambulatory Visit: Admitting: Physician Assistant

## 2023-06-28 ENCOUNTER — Encounter: Payer: Self-pay | Admitting: Physician Assistant

## 2023-06-28 NOTE — Telephone Encounter (Signed)
 Last OV within protocol.  Requested Prescriptions  Pending Prescriptions Disp Refills   DULoxetine (CYMBALTA) 60 MG capsule [Pharmacy Med Name: DULOXETINE DR 60MG  CAPSULES] 90 capsule 0    Sig: TAKE 1 CAPSULE(60 MG) BY MOUTH DAILY     Psychiatry: Antidepressants - SNRI - duloxetine Failed - 06/28/2023 11:47 AM      Failed - Cr in normal range and within 360 days    Creatinine  Date Value Ref Range Status  04/28/2022 0.6 0.6 - 1.3 Final  02/27/2014 1.18 0.60 - 1.30 mg/dL Final   Creatinine, Ser  Date Value Ref Range Status  11/03/2021 0.61 0.61 - 1.24 mg/dL Final   Creatinine, Urine  Date Value Ref Range Status  11/06/2021 103.2  Final         Failed - eGFR is 30 or above and within 360 days    EGFR (African American)  Date Value Ref Range Status  02/27/2014 >60 >76mL/min Final  08/15/2011 >60  Final   GFR calc Af Amer  Date Value Ref Range Status  08/06/2015 >60 >60 mL/min Final    Comment:    (NOTE) The eGFR has been calculated using the CKD EPI equation. This calculation has not been validated in all clinical situations. eGFR's persistently <60 mL/min signify possible Chronic Kidney Disease.    EGFR (Non-African Amer.)  Date Value Ref Range Status  02/27/2014 >60 >38mL/min Final    Comment:    eGFR values <59mL/min/1.73 m2 may be an indication of chronic kidney disease (CKD). Calculated eGFR, using the MRDR Study equation, is useful in  patients with stable renal function. The eGFR calculation will not be reliable in acutely ill patients when serum creatinine is changing rapidly. It is not useful in patients on dialysis. The eGFR calculation may not be applicable to patients at the low and high extremes of body sizes, pregnant women, and vegetarians.   08/15/2011 >60  Final    Comment:    eGFR values <39mL/min/1.73 m2 may be an indication of chronic kidney disease (CKD). Calculated eGFR is useful in patients with stable renal function. The eGFR calculation  will not be reliable in acutely ill patients when serum creatinine is changing rapidly. It is not useful in  patients on dialysis. The eGFR calculation may not be applicable to patients at the low and high extremes of body sizes, pregnant women, and vegetarians.    GFR, Estimated  Date Value Ref Range Status  11/03/2021 >60 >60 mL/min Final    Comment:    (NOTE) Calculated using the CKD-EPI Creatinine Equation (2021)    eGFR  Date Value Ref Range Status  04/28/2022 118  Final         Failed - Valid encounter within last 6 months    Recent Outpatient Visits           3 weeks ago Abscess of axillary region   [facility] at [facility], [place] Alar, [place]   1 month ago Abscess of axillary region   [facility] at [facility], Melton Alar, [place]              Passed - Completed PHQ-2 or PHQ-9 in the last 360 days      Passed - Last BP in normal range    BP Readings from Last 1 Encounters:  06/01/23 108/64

## 2023-06-29 ENCOUNTER — Ambulatory Visit: Admitting: Physician Assistant

## 2023-06-29 ENCOUNTER — Encounter: Payer: Self-pay | Admitting: Physician Assistant

## 2023-06-29 VITALS — BP 112/86 | HR 90 | Temp 97.5°F | Ht 70.0 in | Wt 189.0 lb

## 2023-06-29 DIAGNOSIS — E119 Type 2 diabetes mellitus without complications: Secondary | ICD-10-CM | POA: Diagnosis not present

## 2023-06-29 DIAGNOSIS — Z1159 Encounter for screening for other viral diseases: Secondary | ICD-10-CM

## 2023-06-29 DIAGNOSIS — Z1211 Encounter for screening for malignant neoplasm of colon: Secondary | ICD-10-CM | POA: Diagnosis not present

## 2023-06-29 DIAGNOSIS — Z125 Encounter for screening for malignant neoplasm of prostate: Secondary | ICD-10-CM

## 2023-06-29 DIAGNOSIS — Z23 Encounter for immunization: Secondary | ICD-10-CM

## 2023-06-29 DIAGNOSIS — Z114 Encounter for screening for human immunodeficiency virus [HIV]: Secondary | ICD-10-CM

## 2023-06-29 DIAGNOSIS — Z0000 Encounter for general adult medical examination without abnormal findings: Secondary | ICD-10-CM

## 2023-06-29 DIAGNOSIS — Z7984 Long term (current) use of oral hypoglycemic drugs: Secondary | ICD-10-CM

## 2023-06-29 LAB — POCT GLYCOSYLATED HEMOGLOBIN (HGB A1C): Hemoglobin A1C: 8.8 % — AB (ref 4.0–5.6)

## 2023-06-29 MED ORDER — METFORMIN HCL ER 500 MG PO TB24: 1000.0000 mg | ORAL_TABLET | Freq: Every day | ORAL | 1 refills | Status: DC

## 2023-06-29 NOTE — Patient Instructions (Addendum)
-  It was a pleasure to see you today! Please review your visit summary for helpful information -Lab results are usually available within 1-2 days and we will call once reviewed -I would encourage you to follow your care via MyChart where you can access lab results, notes, messages, and more -If you feel that we did a nice job today, please complete your after-visit survey and leave us  a Google review! Your CMA today was Kieandra and your provider was Cody Das, PA-C, DMSc -Please return for follow-up in about 3 months    Please schedule your dental and eye exams!

## 2023-06-29 NOTE — Progress Notes (Signed)
 Date:  06/29/2023   Name:  Derrick Joyce   DOB:  [birth year]   MRN:  [id number]   Chief Complaint: Annual Exam (Left arm numbness, still happening, they removed the cyst, still happening)  HPI Derrick Joyce presents today for routine physical as he is overdue on several health maintenance items.  Last Physical: >10y ago Last Dental Exam: >10y ago. Brushes once in the morning. Does not floss.  Last Eye Exam: 6-7y ago Last CRC screen: never Last PSA: never Immunizations Due: Tdap, Shingrix  Continues to endorse left sided paresthesia from the ulnar tunnel distally. Mentions it might be related to his neck, XR in Feb 2025, but he denies neck or shoulder pain.   Doing well with Vraylar for bipolar disorder   Medication list has been reviewed and updated.  Current Meds  Medication Sig   cariprazine (VRAYLAR) 3 MG capsule Take 1 capsule (3 mg total) by mouth daily.   DULoxetine (CYMBALTA) 60 MG capsule TAKE 1 CAPSULE(60 MG) BY MOUTH DAILY   IBUPROFEN PO Take by mouth daily.   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   History of nephrolithiasis 05/18/2022   Bipolar disorder with depression (HCC) 11/03/2021   Low back pain 01/27/2018   Venous stasis 09/29/2016   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   Type 2 diabetes mellitus without complication (HCC) 09/23/2016   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) 09/23/2016   Lumbar spondylosis 08/05/2015    Allergies   Allergen Reactions   Gabapentin Other (See Comments)    Suicidal ideation   Cephalexin Rash    Immunization History  Administered Date(s) Administered   PFIZER(Purple Top)SARS-COV-2 Vaccination 06/01/2019, 06/27/2019   Tdap 06/29/2023   Zoster Recombinant(Shingrix) 06/29/2023    Past Surgical History:  Procedure Laterality Date   APPENDECTOMY     BACK SURGERY     HERNIA REPAIR     TYMPANOPLASTY      Social History   Tobacco Use   Smoking status: Never   Smokeless tobacco: Never  Vaping Use   Vaping status: Never Used  Substance Use Topics   Alcohol use: No   Drug use: Yes    Types: Marijuana    Family History  Problem Relation Age of Onset   Bipolar disorder Mother    Diabetes Father    Diabetes Paternal Grandmother    Cancer Paternal Grandmother    Diabetes Paternal Grandfather    Cancer Paternal Grandfather    Heart attack Paternal Grandfather         06/29/2023   11:13 AM 06/01/2023    3:07 PM 05/28/2023   11:27 AM 04/08/2023    3:49 PM  GAD 7 : Generalized Anxiety Score  Nervous, Anxious, on Edge 1 2 3 3   Control/stop worrying 1 1 3 3   Worry too much - different things 1 1 3 3   Trouble relaxing 2 0 3 3  Restless 3 1 3 3   Easily annoyed or irritable 3 2  3 3  Afraid - awful might happen 0 0 0 3  Total GAD 7 Score 11 7 18 21   Anxiety Difficulty Not difficult at all Not difficult at all Somewhat difficult Somewhat difficult       06/29/2023   11:13 AM 06/01/2023    3:06 PM 05/28/2023   11:27 AM  Depression screen PHQ 2/9  Decreased Interest 1 1 1   Down, Depressed, Hopeless 1 1 1   PHQ - 2 Score 2 2 2   Altered sleeping 2 2 2   Tired, decreased energy 2 1 2   Change in appetite 0 1 2  Feeling bad or failure about yourself  1 1 3   Trouble concentrating 1 1 3   Moving slowly or fidgety/restless 3 0 3  Suicidal thoughts 0 0 0  PHQ-9 Score 11 8 17   Difficult doing work/chores Not difficult at all Not difficult at all Somewhat difficult    BP  Readings from Last 3 Encounters:  06/29/23 112/86  06/01/23 108/64  05/28/23 (!) 142/90    Wt Readings from Last 3 Encounters:  06/29/23 189 lb (85.7 kg)  06/01/23 188 lb 3.2 oz (85.4 kg)  05/28/23 182 lb 8.7 oz (82.8 kg)    BP 112/86   Pulse 90   Temp (!) 97.5 F (36.4 C)   Ht 5\' 10"  (1.778 m)   Wt 189 lb (85.7 kg)   SpO2 97%   BMI 27.12 kg/m   Physical Exam Vitals and nursing note reviewed.  Constitutional:      Appearance: Normal appearance.  HENT:     Right Ear: There is impacted cerumen.     Left Ear: There is impacted cerumen.     Ears:     Comments: EAC clear bilaterally with good view of TM which is without effusion or erythema.     Nose: Nose normal.     Mouth/Throat:     Mouth: Mucous membranes are moist. No oral lesions.     Dentition: Abnormal dentition (staining and plaque evident).     Pharynx: No posterior oropharyngeal erythema.  Eyes:     Extraocular Movements: Extraocular movements intact.     Conjunctiva/sclera: Conjunctivae normal.     Pupils: Pupils are equal, round, and reactive to light.  Neck:     Thyroid: No thyromegaly.  Cardiovascular:     Rate and Rhythm: Normal rate and regular rhythm.     Heart sounds: No murmur heard.    No friction rub. No gallop.     Comments: Pulses 2+ at radial, PT, DP bilaterally. No carotid bruit. No peripheral edema Pulmonary:     Effort: Pulmonary effort is normal.     Breath sounds: Normal breath sounds.  Abdominal:     General: Bowel sounds are normal.     Palpations: Abdomen is soft. There is no mass.     Tenderness: There is no abdominal tenderness.  Genitourinary:    Prostate: Normal.     Rectum: Normal. Guaiac result negative.     Comments: Genital exam deferred.  Musculoskeletal:     Comments: Full ROM with strength 5/5 bilateral upper and lower extremities  Lymphadenopathy:     Cervical: No cervical adenopathy.  Skin:    General: Skin is warm.     Capillary Refill: Capillary refill takes  less than 2 seconds.     Findings: No lesion or rash.  Neurological:     Mental Status: He is alert and oriented to person, place, and time.  Gait: Gait is intact.  Psychiatric:        Mood and Affect: Mood normal.        Behavior: Behavior normal.    Diabetic Foot Exam - Simple   Simple Foot Form Diabetic Foot exam was performed with the following findings: Yes 06/29/2023 11:35 AM  Visual Inspection No deformities, no ulcerations, no other skin breakdown bilaterally: Yes Sensation Testing Intact to touch and monofilament testing bilaterally: Yes Pulse Check Posterior Tibialis and Dorsalis pulse intact bilaterally: Yes Comments      Recent Labs     Component Value Date/Time   NA 137 04/28/2022 0000   NA 137 02/27/2014 1650   K 3.9 04/28/2022 0000   K 3.8 02/27/2014 1650   CL 103 04/28/2022 0000   CL 104 02/27/2014 1650   CO2 29 (A) 04/28/2022 0000   CO2 24 02/27/2014 1650   GLUCOSE 160 (H) 11/03/2021 1405   GLUCOSE 110 (H) 02/27/2014 1650   BUN 10 04/28/2022 0000   BUN 16 02/27/2014 1650   CREATININE 0.6 04/28/2022 0000   CREATININE 0.61 11/03/2021 1405   CREATININE 1.18 02/27/2014 1650   CALCIUM 9.4 04/28/2022 0000   CALCIUM 9.3 02/27/2014 1650   PROT 7.8 11/03/2021 1405   PROT 7.5 02/27/2014 1650   ALBUMIN 4.4 04/28/2022 0000   ALBUMIN 4.1 02/27/2014 1650   AST 16 04/28/2022 0000   AST 17 02/27/2014 1650   ALT 21 04/28/2022 0000   ALT 28 02/27/2014 1650   ALKPHOS 75 04/28/2022 0000   ALKPHOS 63 02/27/2014 1650   BILITOT 0.9 11/03/2021 1405   BILITOT 0.8 02/27/2014 1650   GFRNONAA >60 11/03/2021 1405   GFRNONAA >60 02/27/2014 1650   GFRNONAA >60 08/15/2011 0854   GFRAA >60 08/06/2015 0404   GFRAA >60 02/27/2014 1650   GFRAA >60 08/15/2011 0854    Lab Results  Component Value Date   WBC 8.1 04/28/2022   HGB 15.0 04/28/2022   HCT 43 04/28/2022   MCV 87.5 11/03/2021   PLT 236 04/28/2022   Lab Results  Component Value Date   HGBA1C 8.8 (A)  06/29/2023   Lab Results  Component Value Date   CHOL 267 (A) 11/06/2021   HDL 41 11/06/2021   LDLCALC 181 11/06/2021   TRIG 224 (A) 11/06/2021   Lab Results  Component Value Date   TSH 3.45 11/06/2021   TSH 4.70 11/06/2021   Lab Results  Component Value Date   HGBA1C 8.8 (A) 06/29/2023   HGBA1C 8.9 (A) 04/08/2023   HGBA1C 8.3 04/28/2022    Assessment and Plan:  1. Annual physical exam (Primary) Minor abnormalities on exam. Encouraged healthy lifestyle including regular physical activity and consumption of whole fruits and vegetables. Encouraged routine dental and eye exams. Encouraged to brush twice daily, and floss at least twice per week. Vaccinations due today.   Emphasized importance of annual diabetic eye exam and encouraged to schedule.   Check baseline labs today.  - Lipid panel - Comprehensive metabolic panel with GFR - CBC with Differential/Platelet - Microalbumin / creatinine urine ratio - Hepatitis C antibody - HIV Antibody (routine testing w rflx) - PSA - TSH  2. Type 2 diabetes mellitus without complication, without long-term current use of insulin (HCC) A1c 8.8% today not much improved from previous. Patient discouraged by this since he has been making positive changes with regard to diet. Increase metformin to 1000 mg once daily.   - POCT glycosylated hemoglobin (Hb A1C) - Lipid panel - Comprehensive  metabolic panel with GFR - CBC with Differential/Platelet - Microalbumin / creatinine urine ratio - metFORMIN (GLUCOPHAGE-XR) 500 MG 24 hr tablet; Take 2 tablets (1,000 mg total) by mouth daily with breakfast.  Dispense: 180 tablet; Refill: 1  3. Encounter for immunization Tdap and Shingrix #1 administered today. Will complete Shingrix series next visit.   - Tdap vaccine greater than or equal to 7yo IM - Varicella-zoster vaccine IM  4. Screening for colon cancer Ordering Cologuard as initial CRC screen in this patient of average risk.  -  Cologuard  5. Screening PSA (prostate specific antigen)  6. Screening for HIV (human immunodeficiency virus)  7. Need for hepatitis C screening test   Return in about 3 months (around 09/28/2023) for OV f/u chronic conditions.    Cody Das, PA-C, DMSc, Nutritionist [facility] [phone]

## 2023-06-30 ENCOUNTER — Encounter: Payer: Self-pay | Admitting: Physician Assistant

## 2023-06-30 ENCOUNTER — Telehealth: Payer: Self-pay

## 2023-06-30 ENCOUNTER — Ambulatory Visit: Admitting: Physician Assistant

## 2023-06-30 ENCOUNTER — Other Ambulatory Visit: Payer: Self-pay | Admitting: Physician Assistant

## 2023-06-30 VITALS — Ht 70.0 in | Wt 189.0 lb

## 2023-06-30 DIAGNOSIS — H6123 Impacted cerumen, bilateral: Secondary | ICD-10-CM

## 2023-06-30 DIAGNOSIS — E1169 Type 2 diabetes mellitus with other specified complication: Secondary | ICD-10-CM | POA: Insufficient documentation

## 2023-06-30 LAB — CBC WITH DIFFERENTIAL/PLATELET
Basophils Absolute: 0.1 10*3/uL (ref 0.0–0.2)
Basos: 1 %
EOS (ABSOLUTE): 0.5 10*3/uL — ABNORMAL HIGH (ref 0.0–0.4)
Eos: 6 %
Hematocrit: 48.7 % (ref 37.5–51.0)
Hemoglobin: 15.7 g/dL (ref 13.0–17.7)
Immature Grans (Abs): 0 10*3/uL (ref 0.0–0.1)
Immature Granulocytes: 0 %
Lymphocytes Absolute: 2.2 10*3/uL (ref 0.7–3.1)
Lymphs: 25 %
MCH: 29.3 pg (ref 26.6–33.0)
MCHC: 32.2 g/dL (ref 31.5–35.7)
MCV: 91 fL (ref 79–97)
Monocytes Absolute: 0.6 10*3/uL (ref 0.1–0.9)
Monocytes: 7 %
Neutrophils Absolute: 5.2 10*3/uL (ref 1.4–7.0)
Neutrophils: 61 %
Platelets: 231 10*3/uL (ref 150–450)
RBC: 5.35 x10E6/uL (ref 4.14–5.80)
RDW: 12.6 % (ref 11.6–15.4)
WBC: 8.6 10*3/uL (ref 3.4–10.8)

## 2023-06-30 LAB — COMPREHENSIVE METABOLIC PANEL WITH GFR
ALT: 32 IU/L (ref 0–44)
AST: 19 IU/L (ref 0–40)
Albumin: 5 g/dL — ABNORMAL HIGH (ref 3.8–4.9)
Alkaline Phosphatase: 65 IU/L (ref 44–121)
BUN/Creatinine Ratio: 15 (ref 9–20)
BUN: 11 mg/dL (ref 6–24)
Bilirubin Total: 0.4 mg/dL (ref 0.0–1.2)
CO2: 21 mmol/L (ref 20–29)
Calcium: 10 mg/dL (ref 8.7–10.2)
Chloride: 99 mmol/L (ref 96–106)
Creatinine, Ser: 0.74 mg/dL — ABNORMAL LOW (ref 0.76–1.27)
Globulin, Total: 2.1 g/dL (ref 1.5–4.5)
Glucose: 158 mg/dL — ABNORMAL HIGH (ref 70–99)
Potassium: 4.5 mmol/L (ref 3.5–5.2)
Sodium: 140 mmol/L (ref 134–144)
Total Protein: 7.1 g/dL (ref 6.0–8.5)
eGFR: 110 mL/min/{1.73_m2} (ref >59)

## 2023-06-30 LAB — LIPID PANEL
Chol/HDL Ratio: 4.4 ratio (ref 0.0–5.0)
Cholesterol, Total: 194 mg/dL (ref 100–199)
HDL: 44 mg/dL (ref >39)
LDL Chol Calc (NIH): 120 mg/dL — ABNORMAL HIGH (ref 0–99)
Triglycerides: 169 mg/dL — ABNORMAL HIGH (ref 0–149)
VLDL Cholesterol Cal: 30 mg/dL (ref 5–40)

## 2023-06-30 LAB — MICROALBUMIN / CREATININE URINE RATIO
Creatinine, Urine: 113.2 mg/dL
Microalb/Creat Ratio: 27 mg/g{creat} (ref 0–29)
Microalbumin, Urine: 30.1 ug/mL

## 2023-06-30 LAB — PSA: Prostate Specific Ag, Serum: 1.8 ng/mL (ref 0.0–4.0)

## 2023-06-30 LAB — HEPATITIS C ANTIBODY: Hep C Virus Ab: NONREACTIVE

## 2023-06-30 LAB — HIV ANTIBODY (ROUTINE TESTING W REFLEX): HIV Screen 4th Generation wRfx: NONREACTIVE

## 2023-06-30 LAB — TSH: TSH: 6.58 u[IU]/mL — ABNORMAL HIGH (ref 0.450–4.500)

## 2023-06-30 NOTE — Telephone Encounter (Signed)
 Please call pt to schedule an appt.  KP

## 2023-06-30 NOTE — Telephone Encounter (Signed)
-----   Message from Danelle Dunning sent at 06/29/2023  4:21 PM EDT ----- Regarding: Ear Lavage We did not have time today to irrigate Derrick Joyce's ears but he has bilateral impaction. Please give him a call to see if he would want to schedule an OV for this in the near future. Thanks!

## 2023-06-30 NOTE — Progress Notes (Signed)
 Date:  06/30/2023   Name:  Derrick Joyce   DOB:  April 30, 1970   MRN:  [id number]   Chief Complaint: Cerumen Impaction  HPI Derrick Joyce was just seen by me yesterday, when a bilateral cerumen impaction is noted during his routine physical exam.  Unfortunately we were unable to irrigate the ears at that visit, hence his return today.  Complains of hearing loss.  Has recently been using ear buds more frequently.   Medication list has been reviewed and updated.  Current Meds  Medication Sig   cariprazine (VRAYLAR) 3 MG capsule Take 1 capsule (3 mg total) by mouth daily.   DULoxetine (CYMBALTA) 60 MG capsule TAKE 1 CAPSULE(60 MG) BY MOUTH DAILY   IBUPROFEN PO Take by mouth daily.   metFORMIN (GLUCOPHAGE-XR) 500 MG 24 hr tablet Take 2 tablets (1,000 mg total) by mouth daily with breakfast.   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   History of nephrolithiasis 05/18/2022   Bipolar disorder with depression (HCC) 11/03/2021   Low back pain 01/27/2018   Venous stasis 09/29/2016   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   Type 2 diabetes mellitus with other specified complication (HCC) 09/23/2016   Lumbar spondylosis 08/05/2015    Allergies  Allergen Reactions   Gabapentin Other (See Comments)    Suicidal ideation   Cephalexin Rash    Immunization History  Administered Date(s) Administered   PFIZER(Purple Top)SARS-COV-2 Vaccination 06/01/2019, 06/27/2019   Tdap 06/29/2023   Zoster Recombinant(Shingrix)  06/29/2023    Past Surgical History:  Procedure Laterality Date   APPENDECTOMY     BACK SURGERY     HERNIA REPAIR     TYMPANOPLASTY      Social History   Tobacco Use   Smoking status: Never   Smokeless tobacco: Never  Vaping Use   Vaping status: Never Used  Substance Use Topics   Alcohol use: No   Drug use: Yes    Types: Marijuana    Family History  Problem Relation Age of Onset   Bipolar disorder Mother    Diabetes Father    Diabetes Paternal Grandmother    Cancer Paternal Grandmother    Diabetes Paternal Grandfather    Cancer Paternal Grandfather    Heart attack Paternal Grandfather         06/29/2023   11:13 AM 06/01/2023    3:07 PM 05/28/2023   11:27 AM 04/08/2023    3:49 PM  GAD 7 : Generalized Anxiety Score  Nervous, Anxious, on Edge 1 2 3 3   Control/stop worrying 1 1 3 3   Worry too much - different things 1 1 3 3   Trouble relaxing 2 0 3 3  Restless 3 1 3 3   Easily annoyed or irritable 3 2 3 3   Afraid - awful might happen 0 0 0 3  Total GAD 7 Score 11 7 18 21   Anxiety Difficulty Not difficult at all Not difficult at all Somewhat difficult Somewhat difficult       06/29/2023   11:13 AM 06/01/2023  3:06 PM 05/28/2023   11:27 AM  Depression screen PHQ 2/9  Decreased Interest 1 1 1   Down, Depressed, Hopeless 1 1 1   PHQ - 2 Score 2 2 2   Altered sleeping 2 2 2   Tired, decreased energy 2 1 2   Change in appetite 0 1 2  Feeling bad or failure about yourself  1 1 3   Trouble concentrating 1 1 3   Moving slowly or fidgety/restless 3 0 3  Suicidal thoughts 0 0 0  PHQ-9 Score 11 8 17   Difficult doing work/chores Not difficult at all Not difficult at all Somewhat difficult    BP Readings from Last 3 Encounters:  06/29/23 112/86  06/01/23 108/64  05/28/23 (!) 142/90    Wt Readings from Last 3 Encounters:  06/30/23 189 lb (85.7 kg)  06/29/23 189 lb (85.7 kg)  06/01/23 188 lb 3.2 oz (85.4 kg)    Ht 5\' 10"  (1.778 m)   Wt 189 lb (85.7 kg)   BMI 27.12  kg/m   Physical Exam HENT:     Right Ear: There is impacted cerumen.     Left Ear: There is impacted cerumen.     Recent Labs     Component Value Date/Time   NA 140 06/29/2023 1157   NA 137 02/27/2014 1650   K 4.5 06/29/2023 1157   K 3.8 02/27/2014 1650   CL 99 06/29/2023 1157   CL 104 02/27/2014 1650   CO2 21 06/29/2023 1157   CO2 24 02/27/2014 1650   GLUCOSE 158 (H) 06/29/2023 1157   GLUCOSE 160 (H) 11/03/2021 1405   GLUCOSE 110 (H) 02/27/2014 1650   BUN 11 06/29/2023 1157   BUN 16 02/27/2014 1650   CREATININE 0.74 (L) 06/29/2023 1157   CREATININE 1.18 02/27/2014 1650   CALCIUM 10.0 06/29/2023 1157   CALCIUM 9.3 02/27/2014 1650   PROT 7.1 06/29/2023 1157   PROT 7.5 02/27/2014 1650   ALBUMIN 5.0 (H) 06/29/2023 1157   ALBUMIN 4.1 02/27/2014 1650   AST 19 06/29/2023 1157   AST 17 02/27/2014 1650   ALT 32 06/29/2023 1157   ALT 28 02/27/2014 1650   ALKPHOS 65 06/29/2023 1157   ALKPHOS 63 02/27/2014 1650   BILITOT 0.4 06/29/2023 1157   BILITOT 0.8 02/27/2014 1650   GFRNONAA >60 11/03/2021 1405   GFRNONAA >60 02/27/2014 1650   GFRNONAA >60 08/15/2011 0854   GFRAA >60 08/06/2015 0404   GFRAA >60 02/27/2014 1650   GFRAA >60 08/15/2011 0854    Lab Results  Component Value Date   WBC 8.6 06/29/2023   HGB 15.7 06/29/2023   HCT 48.7 06/29/2023   MCV 91 06/29/2023   PLT 231 06/29/2023   Lab Results  Component Value Date   HGBA1C 8.8 (A) 06/29/2023   Lab Results  Component Value Date   CHOL 194 06/29/2023   HDL 44 06/29/2023   LDLCALC 120 (H) 06/29/2023   TRIG 169 (H) 06/29/2023   CHOLHDL 4.4 06/29/2023   Lab Results  Component Value Date   TSH 6.580 (H) 06/29/2023     Assessment and Plan:  1. Hearing loss due to cerumen impaction, bilateral (Primary) Small amount of cerumen was removed with curette by provider, but remainder could not be removed with curette alone. EAC lavaged with warm water and peroxide mixture until free of cerumen. Patient  tolerated well without complications. Afterwards, TM was gray with good landmarks and light reflexes.  Can use wax softener q 2 weeks to prevent further wax buildup.  Also advised do not use Qtips in the [facility].  Discussed frequent use of the earbud/earplugs can contribute to cerumen impaction, over the ear headphones would be better.      Cody Das, PA-C, DMSc, Nutritionist [facility] [phone]

## 2023-08-23 NOTE — Progress Notes (Deleted)
  Start: *** end: *** Patient is here today *** Patient would like to learn *** Patient lives with ***.  *** shopping and cooking.  History includes:  *** Medications include:  *** Labs noted:  ***   8.8%, met, TG 169, LDL 120, crestor

## 2023-08-30 ENCOUNTER — Ambulatory Visit: Admitting: Dietician

## 2023-08-30 DIAGNOSIS — E1169 Type 2 diabetes mellitus with other specified complication: Secondary | ICD-10-CM

## 2023-09-08 LAB — CBC AND DIFFERENTIAL
HCT: 48 (ref 41–53)
Hemoglobin: 14.7 (ref 13.5–17.5)
Platelets: 212 K/uL (ref 150–400)
WBC: 6.8

## 2023-09-08 LAB — HEMOGLOBIN A1C: Hemoglobin A1C: 9.9

## 2023-09-08 LAB — BASIC METABOLIC PANEL WITH GFR
BUN: 11 (ref 4–21)
CO2: 20 (ref 13–22)
Chloride: 101 (ref 99–108)
Creatinine: 0.8 (ref 0.6–1.3)
Glucose: 239
Sodium: 5 — AB (ref 137–147)

## 2023-09-08 LAB — LIPID PANEL
Cholesterol: 138 (ref 0–200)
HDL: 39 (ref 35–70)
LDL Cholesterol: 72
Triglycerides: 159 (ref 40–160)

## 2023-09-08 LAB — HEPATIC FUNCTION PANEL
ALT: 29 U/L (ref 10–40)
AST: 17 (ref 14–40)
Alkaline Phosphatase: 55 (ref 25–125)
Bilirubin, Total: 0.3

## 2023-09-08 LAB — COMPREHENSIVE METABOLIC PANEL WITH GFR
Albumin: 4.6 (ref 3.5–5.0)
Calcium: 9.8 (ref 8.7–10.7)
Globulin: 2.1
eGFR: 108

## 2023-09-08 LAB — CBC: RBC: 5.19 — AB (ref 3.87–5.11)

## 2023-09-08 LAB — PSA: PSA: 1.3

## 2023-09-21 ENCOUNTER — Other Ambulatory Visit: Payer: Self-pay | Admitting: Physician Assistant

## 2023-09-23 NOTE — Telephone Encounter (Signed)
 Requested Prescriptions  Pending Prescriptions Disp Refills   rosuvastatin  (CRESTOR ) 40 MG tablet [Pharmacy Med Name: ROSUVASTATIN  40MG  TABLETS] 90 tablet 2    Sig: TAKE 1 TABLET(40 MG) BY MOUTH DAILY     Cardiovascular:  Antilipid - Statins 2 Failed - 09/23/2023  1:14 PM      Failed - Cr in normal range and within 360 days    Creatinine  Date Value Ref Range Status  02/27/2014 1.18 0.60 - 1.30 mg/dL Final   Creatinine, Ser  Date Value Ref Range Status  06/29/2023 0.74 (L) 0.76 - 1.27 mg/dL Final   Creatinine, Urine  Date Value Ref Range Status  11/06/2021 103.2  Final         Failed - Lipid Panel in normal range within the last 12 months    Cholesterol, Total  Date Value Ref Range Status  06/29/2023 194 100 - 199 mg/dL Final   LDL Chol Calc (NIH)  Date Value Ref Range Status  06/29/2023 120 (H) 0 - 99 mg/dL Final   HDL  Date Value Ref Range Status  06/29/2023 44 >39 mg/dL Final   Triglycerides  Date Value Ref Range Status  06/29/2023 169 (H) 0 - 149 mg/dL Final         Passed - Patient is not pregnant      Passed - Valid encounter within last 12 months    Recent Outpatient Visits           2 months ago Hearing loss due to cerumen impaction, bilateral   [place] [facility] at [facility], [place]io SQUIBB, [place]   2 months ago Annual physical exam   [facility] at [facility], Toribio SQUIBB, [place]   3 months ago Abscess of axillary region   [facility] Medicine at [facility], Toribio SQUIBB, [place]   3 months ago Abscess of axillary region   [facility] Sports Medicine at [facility], Toribio SQUIBB, [place]

## 2023-09-28 ENCOUNTER — Ambulatory Visit: Admitting: Physician Assistant

## 2023-09-30 ENCOUNTER — Ambulatory Visit: Admitting: Physician Assistant

## 2023-10-04 ENCOUNTER — Other Ambulatory Visit: Payer: Self-pay | Admitting: Physician Assistant

## 2023-10-05 NOTE — Telephone Encounter (Signed)
 Requested Prescriptions  Pending Prescriptions Disp Refills   DULoxetine  (CYMBALTA ) 60 MG capsule [Pharmacy Med Name: DULOXETINE  DR 60MG  CAPSULES] 90 capsule 0    Sig: TAKE 1 CAPSULE(60 MG) BY MOUTH DAILY     Psychiatry: Antidepressants - SNRI - duloxetine  Failed - 10/05/2023  5:32 PM      Failed - Cr in normal range and within 360 days    Creatinine  Date Value Ref Range Status  02/27/2014 1.18 0.60 - 1.30 mg/dL Final   Creatinine, Ser  Date Value Ref Range Status  06/29/2023 0.74 (L) 0.76 - 1.27 mg/dL Final   Creatinine, Urine  Date Value Ref Range Status  11/06/2021 103.2  Final         Passed - eGFR is 30 or above and within 360 days    EGFR (African American)  Date Value Ref Range Status  02/27/2014 >60 >33mL/min Final  08/15/2011 >60  Final   GFR calc Af Amer  Date Value Ref Range Status  08/06/2015 >60 >60 mL/min Final    Comment:    (NOTE) The eGFR has been calculated using the CKD EPI equation. This calculation has not been validated in all clinical situations. eGFR's persistently <60 mL/min signify possible Chronic Kidney Disease.    EGFR (Non-African Amer.)  Date Value Ref Range Status  02/27/2014 >60 >8mL/min Final    Comment:    eGFR values <42mL/min/1.73 m2 may be an indication of chronic kidney disease (CKD). Calculated eGFR, using the MRDR Study equation, is useful in  patients with stable renal function. The eGFR calculation will not be reliable in acutely ill patients when serum creatinine is changing rapidly. It is not useful in patients on dialysis. The eGFR calculation may not be applicable to patients at the low and high extremes of body sizes, pregnant women, and vegetarians.   08/15/2011 >60  Final    Comment:    eGFR values <51mL/min/1.73 m2 may be an indication of chronic kidney disease (CKD). Calculated eGFR is useful in patients with stable renal function. The eGFR calculation will not be reliable in acutely ill patients when serum  creatinine is changing rapidly. It is not useful in  patients on dialysis. The eGFR calculation may not be applicable to patients at the low and high extremes of body sizes, pregnant women, and vegetarians.    GFR, Estimated  Date Value Ref Range Status  11/03/2021 >60 >60 mL/min Final    Comment:    (NOTE) Calculated using the CKD-EPI Creatinine Equation (2021)    eGFR  Date Value Ref Range Status  06/29/2023 110 >59 mL/min/1.73 Final         Passed - Completed PHQ-2 or PHQ-9 in the last 360 days      Passed - Last BP in normal range    BP Readings from Last 1 Encounters:  06/29/23 112/86         Passed - Valid encounter within last 6 months    Recent Outpatient Visits           3 months ago Hearing loss due to cerumen impaction, bilateral   [place] [facility] at [facility], Toribio SQUIBB, [place]   3 months ago Annual physical exam   [facility] at [facility], Toribio SQUIBB, [place]   4 months ago Abscess of axillary region   [facility] Medicine at [facility], Toribio SQUIBB, [place]   4  months ago Abscess of axillary region   [facility] Primary Care & Sports Medicine at [facility], Toribio SQUIBB, [place]

## 2023-10-06 ENCOUNTER — Encounter: Payer: Self-pay | Admitting: Physician Assistant

## 2023-10-13 ENCOUNTER — Telehealth: Payer: Self-pay | Admitting: Physician Assistant

## 2023-10-13 ENCOUNTER — Other Ambulatory Visit (HOSPITAL_COMMUNITY): Payer: Self-pay

## 2023-10-13 ENCOUNTER — Encounter: Payer: Self-pay | Admitting: Physician Assistant

## 2023-10-13 ENCOUNTER — Telehealth: Payer: Self-pay | Admitting: Pharmacy Technician

## 2023-10-13 ENCOUNTER — Ambulatory Visit: Admitting: Physician Assistant

## 2023-10-13 VITALS — BP 104/80 | HR 96 | Temp 97.8°F | Ht 70.0 in | Wt 188.0 lb

## 2023-10-13 DIAGNOSIS — E1169 Type 2 diabetes mellitus with other specified complication: Secondary | ICD-10-CM

## 2023-10-13 DIAGNOSIS — Z7985 Long-term (current) use of injectable non-insulin antidiabetic drugs: Secondary | ICD-10-CM | POA: Diagnosis not present

## 2023-10-13 DIAGNOSIS — R739 Hyperglycemia, unspecified: Secondary | ICD-10-CM

## 2023-10-13 MED ORDER — FREESTYLE LIBRE 3 PLUS SENSOR MISC: Status: DC

## 2023-10-13 MED ORDER — MOUNJARO 2.5 MG/0.5ML ~~LOC~~ SOAJ: 2.5000 mg | SUBCUTANEOUS | Status: DC

## 2023-10-13 NOTE — Assessment & Plan Note (Signed)
 Stop metformin .  Begin Mounjaro , 1 month supply given as a sample to the patient today.  Administration demo to patient today.  We discussed the mechanism of action, side effects, risks, and contraindications of GLP medication.  He has no history of pancreatitis and no family history of thyroid cancer.  Advised the importance of adequate protein intake on this medication as well as resistance training which will not only improve insulin sensitivity but also build muscle mass and reduce risk of atrophy.  Advised importance of portion control and slower eating on this medication to minimize GI side effects. Avoid high fat foods as these may cause discomfort.   Also was given a sample of freestyle libre 3+, tutorial video shown.  Advised to call the nutrition center to reschedule his appointment with dietitian, phone number given.  Will be testing for LADA at his convenience, given the relatively acute onset in adulthood and his lack of response to metformin .

## 2023-10-13 NOTE — Telephone Encounter (Signed)
 Please initiate PA for Mounjaro  for DM2, thanks!

## 2023-10-13 NOTE — Telephone Encounter (Signed)
 Pharmacy Patient Advocate Encounter   Received notification from Pt Calls Messages that prior authorization for Mounjaro  2.5MG /0.5ML auto-injectors is required/requested.   Insurance verification completed.   The patient is insured through [facility] .   Per test claim: The current 28 day co-pay is, $25.00.  No PA needed at this time. This test claim was processed through [facility]- copay amounts may vary at other pharmacies due to pharmacy/plan contracts, or as the patient moves through the different stages of their insurance plan.

## 2023-10-13 NOTE — Progress Notes (Signed)
 Date:  10/13/2023   Name:  Derrick Joyce   DOB:  02-Aug-1970   MRN:  [id number]   Chief Complaint: Diabetes (Wants new medication, pt states dad has bad kidneys due to taking medication /)  HPI Derrick Joyce returns to clinic today due to increasing concerns for his diabetes, which despite his best efforts seems to be progressing.  He had repeat labs with A1c through work just over a month ago on 09/08/2023, A1c was 9.9% at that time which had increased from our previous 8.8% in April.  This is ironic considering we increased his metformin  dosing at last visit, and he reports good compliance with this and nutrition modification.  He was also referred to dietitian, but unfortunately no showed.  He endorses family history of diabetes.  Patient states that his diabetes was sudden onset as opposed to gradual, diagnosed at least 10 years ago.   Medication list has been reviewed and updated.  Current Meds  Medication Sig   cariprazine  (VRAYLAR ) 3 MG capsule Take 1 capsule (3 mg total) by mouth daily.   Continuous Glucose Sensor (FREESTYLE LIBRE 3 PLUS SENSOR) MISC Change sensor every 15 days.   DULoxetine  (CYMBALTA ) 60 MG capsule TAKE 1 CAPSULE(60 MG) BY MOUTH DAILY   IBUPROFEN PO Take by mouth daily.   metFORMIN  (GLUCOPHAGE -XR) 500 MG 24 hr tablet Take 2 tablets (1,000 mg total) by mouth daily with breakfast.   MOUNJARO  2.5 MG/0.5ML Pen Inject 2.5 mg into the skin once a week.   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   History of nephrolithiasis 05/18/2022   Bipolar disorder with depression (HCC) 11/03/2021   Low back pain 01/27/2018   Venous stasis 09/29/2016   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   Type 2 diabetes mellitus with other specified complication (HCC) 09/23/2016   Lumbar spondylosis 08/05/2015    Allergies  Allergen Reactions   Gabapentin  Other (See Comments)    Suicidal ideation   Cephalexin Rash    Immunization History  Administered Date(s) Administered   PFIZER(Purple Top)SARS-COV-2 Vaccination 06/01/2019, 06/27/2019   Tdap 06/29/2023   Zoster Recombinant(Shingrix ) 06/29/2023    Past Surgical History:  Procedure Laterality Date   APPENDECTOMY     BACK SURGERY     HERNIA REPAIR     TYMPANOPLASTY      Social History   Tobacco Use   Smoking status: Never   Smokeless tobacco: Never  Vaping Use   Vaping status: Never Used  Substance Use Topics   Alcohol use: No   Drug use: Yes    Types: Marijuana    Family History  Problem Relation Age of Onset   Bipolar disorder Mother    Diabetes Father    Diabetes Paternal Grandmother    Cancer Paternal Grandmother    Diabetes Paternal Grandfather    Cancer Paternal Grandfather    Heart attack Paternal Grandfather         06/29/2023   11:13 AM 06/01/2023  3:07 PM 05/28/2023   11:27 AM 04/08/2023    3:49 PM  GAD 7 : Generalized Anxiety Score  Nervous, Anxious, on Edge 1 2 3 3   Control/stop worrying 1 1 3 3   Worry too much - different things 1 1 3 3   Trouble relaxing 2 0 3 3  Restless 3 1 3 3   Easily annoyed or irritable 3 2 3 3   Afraid - awful might happen 0 0 0 3  Total GAD 7 Score 11 7 18 21   Anxiety Difficulty Not difficult at all Not difficult at all Somewhat difficult Somewhat difficult       06/29/2023   11:13 AM 06/01/2023    3:06 PM 05/28/2023   11:27 AM  Depression screen PHQ 2/9  Decreased Interest 1 1 1   Down,  Depressed, Hopeless 1 1 1   PHQ - 2 Score 2 2 2   Altered sleeping 2 2 2   Tired, decreased energy 2 1 2   Change in appetite 0 1 2  Feeling bad or failure about yourself  1 1 3   Trouble concentrating 1 1 3   Moving slowly or fidgety/restless 3 0 3  Suicidal thoughts 0 0 0  PHQ-9 Score 11 8 17   Difficult doing work/chores Not difficult at all Not difficult at all Somewhat difficult    BP Readings from Last 3 Encounters:  10/13/23 104/80  06/29/23 112/86  06/01/23 108/64    Wt Readings from Last 3 Encounters:  10/13/23 188 lb (85.3 kg)  06/30/23 189 lb (85.7 kg)  06/29/23 189 lb (85.7 kg)    BP 104/80   Pulse 96   Temp 97.8 F (36.6 C)   Ht 5' 10 (1.778 m)   Wt 188 lb (85.3 kg)   SpO2 96%   BMI 26.98 kg/m   Physical Exam Vitals and nursing note reviewed.  Constitutional:      Appearance: Normal appearance.  Cardiovascular:     Rate and Rhythm: Normal rate.  Pulmonary:     Effort: Pulmonary effort is normal.  Abdominal:     General: There is no distension.  Musculoskeletal:        General: Normal range of motion.  Skin:    General: Skin is warm and dry.  Neurological:     Mental Status: He is alert and oriented to person, place, and time.     Gait: Gait is intact.  Psychiatric:        Mood and Affect: Mood and affect normal.     Recent Labs     Component Value Date/Time   NA 5 (A) 09/08/2023 0000   NA 137 02/27/2014 1650   K 4.5 06/29/2023 1157   K 3.8 02/27/2014 1650   CL 101 09/08/2023 0000   CL 104 02/27/2014 1650   CO2 20 09/08/2023 0000   CO2 24 02/27/2014 1650   GLUCOSE 158 (H) 06/29/2023 1157   GLUCOSE 160 (H) 11/03/2021 1405   GLUCOSE 110 (H) 02/27/2014 1650   BUN 11 09/08/2023 0000   BUN 16 02/27/2014 1650   CREATININE 0.8 09/08/2023 0000   CREATININE 0.74 (L) 06/29/2023 1157   CREATININE 1.18 02/27/2014 1650   CALCIUM  9.8 09/08/2023 0000   CALCIUM  9.3 02/27/2014 1650   PROT 7.1 06/29/2023 1157   PROT 7.5 02/27/2014 1650   ALBUMIN 4.6  09/08/2023 0000   ALBUMIN 5.0 (H) 06/29/2023 1157   ALBUMIN 4.1 02/27/2014 1650   AST 17 09/08/2023 0000   AST 17 02/27/2014 1650   ALT 29  09/08/2023 0000   ALT 28 02/27/2014 1650   ALKPHOS 55 09/08/2023 0000   ALKPHOS 63 02/27/2014 1650   BILITOT 0.4 06/29/2023 1157   BILITOT 0.8 02/27/2014 1650   GFRNONAA >60 11/03/2021 1405   GFRNONAA >60 02/27/2014 1650   GFRNONAA >60 08/15/2011 0854   GFRAA >60 08/06/2015 0404   GFRAA >60 02/27/2014 1650   GFRAA >60 08/15/2011 0854    Lab Results  Component Value Date   WBC 6.8 09/08/2023   HGB 14.7 09/08/2023   HCT 48 09/08/2023   MCV 91 06/29/2023   PLT 212 09/08/2023   Lab Results  Component Value Date   HGBA1C 9.9 09/08/2023   Lab Results  Component Value Date   CHOL 138 09/08/2023   HDL 39 09/08/2023   LDLCALC 72 09/08/2023   TRIG 159 09/08/2023   CHOLHDL 4.4 06/29/2023   Lab Results  Component Value Date   TSH 6.580 (H) 06/29/2023     Assessment and Plan:  Type 2 diabetes mellitus with other specified complication, without long-term current use of insulin (HCC) Assessment & Plan: Stop metformin .  Begin Mounjaro , 1 month supply given as a sample to the patient today.  Administration demo to patient today.  We discussed the mechanism of action, side effects, risks, and contraindications of GLP medication.  He has no history of pancreatitis and no family history of thyroid cancer.  Advised the importance of adequate protein intake on this medication as well as resistance training which will not only improve insulin sensitivity but also build muscle mass and reduce risk of atrophy.  Advised importance of portion control and slower eating on this medication to minimize GI side effects. Avoid high fat foods as these may cause discomfort.   Also was given a sample of freestyle libre 3+, tutorial video shown.  Advised to call the nutrition center to reschedule his appointment with dietitian, phone number given.  Will be  testing for LADA at his convenience, given the relatively acute onset in adulthood and his lack of response to metformin .    Orders: -     C-peptide -     Diabetes Autoimmune Profile -     Mounjaro ; Inject 2.5 mg into the skin once a week.  Dispense: 2 mL -     FreeStyle Libre 3 Plus Sensor; Change sensor every 15 days.  Dispense: 1 each  Long-term current use of injectable noninsulin antidiabetic medication  Hyperglycemia -     C-peptide -     Diabetes Autoimmune Profile     Return in about 3 weeks (around 11/03/2023) for OV f/u Mounjaro .    Rolan Hoyle, PA-C, DMSc, Nutritionist [facility] and [facility] [phone]

## 2023-10-13 NOTE — Telephone Encounter (Signed)
 PA not required per plan message.

## 2023-10-20 ENCOUNTER — Ambulatory Visit (INDEPENDENT_AMBULATORY_CARE_PROVIDER_SITE_OTHER): Admitting: Physician Assistant

## 2023-10-20 DIAGNOSIS — Z7985 Long-term (current) use of injectable non-insulin antidiabetic drugs: Secondary | ICD-10-CM

## 2023-10-20 DIAGNOSIS — E1169 Type 2 diabetes mellitus with other specified complication: Secondary | ICD-10-CM

## 2023-10-20 DIAGNOSIS — Z7984 Long term (current) use of oral hypoglycemic drugs: Secondary | ICD-10-CM | POA: Diagnosis not present

## 2023-10-20 LAB — HM DIABETES EYE EXAM

## 2023-10-20 NOTE — Progress Notes (Signed)
 Derrick Joyce arrived 10/20/2023 and has given verbal consent to obtain images and complete their overdue diabetic retinal screening.  The images have been sent to an ophthalmologist or optometrist for review and interpretation.  Results will be sent back to Manya Toribio SQUIBB, PA for review.  Patient has been informed they will be contacted when we receive the results via telephone or MyChart

## 2023-10-25 NOTE — Progress Notes (Signed)
 Received results- No diabetic retinopathy shown. Results will be sent to provider. PCP clinical team will need to call patient with results.

## 2023-10-26 ENCOUNTER — Other Ambulatory Visit: Payer: Self-pay

## 2023-10-26 DIAGNOSIS — F319 Bipolar disorder, unspecified: Secondary | ICD-10-CM

## 2023-10-26 MED ORDER — CARIPRAZINE HCL 3 MG PO CAPS: 3.0000 mg | ORAL_CAPSULE | Freq: Every day | ORAL | 0 refills | Status: DC

## 2023-10-27 ENCOUNTER — Encounter: Payer: Self-pay | Admitting: Physician Assistant

## 2023-11-08 ENCOUNTER — Encounter: Payer: Self-pay | Admitting: Physician Assistant

## 2023-11-08 ENCOUNTER — Ambulatory Visit: Admitting: Physician Assistant

## 2023-11-08 VITALS — BP 102/84 | HR 99 | Temp 97.9°F | Ht 70.0 in | Wt 180.0 lb

## 2023-11-08 DIAGNOSIS — E1169 Type 2 diabetes mellitus with other specified complication: Secondary | ICD-10-CM | POA: Diagnosis not present

## 2023-11-08 DIAGNOSIS — R739 Hyperglycemia, unspecified: Secondary | ICD-10-CM | POA: Diagnosis not present

## 2023-11-08 DIAGNOSIS — Z7985 Long-term (current) use of injectable non-insulin antidiabetic drugs: Secondary | ICD-10-CM | POA: Diagnosis not present

## 2023-11-08 DIAGNOSIS — Z23 Encounter for immunization: Secondary | ICD-10-CM

## 2023-11-08 MED ORDER — MOUNJARO 5 MG/0.5ML ~~LOC~~ SOAJ
5.0000 mg | SUBCUTANEOUS | 0 refills | Status: DC
Start: 2023-11-08 — End: 2023-12-14

## 2023-11-08 NOTE — Assessment & Plan Note (Signed)
 Increase Mounjaro  to 5 mg weekly, seems to be doing well with this medication.

## 2023-11-08 NOTE — Progress Notes (Signed)
 Date:  11/08/2023   Name:  Derrick Joyce   DOB:  30-Dec-1970   MRN:  [id number]   Chief Complaint: Diabetes (High readings at home )  HPI Abdulai returns for 3-week follow-up after initiating Mounjaro  2.5 mg weekly for DM2, last A1c 9.9% on 09/08/2023.  I have also ordered testing for LADA/type 1.5 diabetes which has not yet been completed.  He seems to be tolerating Mounjaro  well.  He was given a sample of Freestyle Libre last visit, though he is not on an insulin regimen.  According to the CGM, he is having morning spikes around 6 a.m. between 200-250 mg/dL.   No-showed dietician in June and needs to reschedule  Due for Shingrix  #2 today.   Never picked up Vraylar  from the pharmacy, so they put it back.    Medication list has been reviewed and updated.  Current Meds  Medication Sig   Continuous Glucose Sensor (FREESTYLE LIBRE 3 PLUS SENSOR) MISC Change sensor every 15 days.   DULoxetine  (CYMBALTA ) 60 MG capsule TAKE 1 CAPSULE(60 MG) BY MOUTH DAILY   IBUPROFEN PO Take by mouth daily.   MOUNJARO  5 MG/0.5ML Pen Inject 5 mg into the skin once a week.   rosuvastatin  (CRESTOR ) 40 MG tablet TAKE 1 TABLET(40 MG) BY MOUTH DAILY   [DISCONTINUED] metFORMIN  (GLUCOPHAGE -XR) 500 MG 24 hr tablet Take 2 tablets (1,000 mg total) by mouth daily with breakfast.   [DISCONTINUED] MOUNJARO  2.5 MG/0.5ML Pen Inject 2.5 mg into the skin once a week.     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   History of  nephrolithiasis 05/18/2022   Bipolar disorder with depression (HCC) 11/03/2021   Low back pain 01/27/2018   Venous stasis 09/29/2016   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   Type 2 diabetes mellitus with other specified complication (HCC) 09/23/2016   Lumbar spondylosis 08/05/2015    Allergies  Allergen Reactions   Gabapentin  Other (See Comments)    Suicidal ideation   Cephalexin Rash    Immunization History  Administered Date(s) Administered   PFIZER(Purple Top)SARS-COV-2 Vaccination 06/01/2019, 06/27/2019   Tdap 06/29/2023   Zoster Recombinant(Shingrix ) 06/29/2023, 11/08/2023    Past Surgical History:  Procedure Laterality Date   APPENDECTOMY     BACK SURGERY     HERNIA REPAIR     TYMPANOPLASTY      Social History   Tobacco Use   Smoking status: Never   Smokeless tobacco: Never  Vaping Use   Vaping status: Never Used  Substance Use Topics   Alcohol use: No   Drug use: Yes    Types: Marijuana    Family History  Problem Relation Age of Onset   Bipolar disorder Mother    Diabetes Father    Diabetes Paternal Grandmother    Cancer Paternal Grandmother    Diabetes Paternal Grandfather    Cancer Paternal Grandfather    Heart attack Paternal Grandfather         11/08/2023    2:01 PM 06/29/2023   11:13 AM 06/01/2023    3:07 PM  05/28/2023   11:27 AM  GAD 7 : Generalized Anxiety Score  Nervous, Anxious, on Edge 1 1 2 3   Control/stop worrying 1 1 1 3   Worry too much - different things 1 1 1 3   Trouble relaxing 1 2 0 3  Restless 2 3 1 3   Easily annoyed or irritable 2 3 2 3   Afraid - awful might happen 0 0 0 0  Total GAD 7 Score 8 11 7 18   Anxiety Difficulty Somewhat difficult Not difficult at all Not difficult at all Somewhat difficult       11/08/2023    2:01 PM 06/29/2023   11:13 AM 06/01/2023    3:06 PM  Depression screen PHQ 2/9  Decreased Interest 0 1 1  Down, Depressed, Hopeless 0 1 1  PHQ - 2 Score 0 2 2  Altered sleeping  2 2  Tired, decreased  energy  2 1  Change in appetite  0 1  Feeling bad or failure about yourself   1 1  Trouble concentrating  1 1  Moving slowly or fidgety/restless  3 0  Suicidal thoughts  0 0  PHQ-9 Score  11 8  Difficult doing work/chores  Not difficult at all Not difficult at all    BP Readings from Last 3 Encounters:  11/08/23 102/84  10/13/23 104/80  06/29/23 112/86    Wt Readings from Last 3 Encounters:  11/08/23 180 lb (81.6 kg)  10/13/23 188 lb (85.3 kg)  06/30/23 189 lb (85.7 kg)    BP 102/84   Pulse 99   Temp 97.9 F (36.6 C)   Ht 5' 10 (1.778 m)   Wt 180 lb (81.6 kg)   SpO2 96%   BMI 25.83 kg/m   Physical Exam Vitals and nursing note reviewed.  Constitutional:      Appearance: Normal appearance.  Cardiovascular:     Rate and Rhythm: Normal rate.  Pulmonary:     Effort: Pulmonary effort is normal.  Abdominal:     General: There is no distension.  Musculoskeletal:        General: Normal range of motion.  Skin:    General: Skin is warm and dry.  Neurological:     Mental Status: He is alert and oriented to person, place, and time.     Gait: Gait is intact.  Psychiatric:        Mood and Affect: Mood and affect normal.     Recent Labs     Component Value Date/Time   NA 5 (A) 09/08/2023 0000   NA 137 02/27/2014 1650   K 4.5 06/29/2023 1157   K 3.8 02/27/2014 1650   CL 101 09/08/2023 0000   CL 104 02/27/2014 1650   CO2 20 09/08/2023 0000   CO2 24 02/27/2014 1650   GLUCOSE 158 (H) 06/29/2023 1157   GLUCOSE 160 (H) 11/03/2021 1405   GLUCOSE 110 (H) 02/27/2014 1650   BUN 11 09/08/2023 0000   BUN 16 02/27/2014 1650   CREATININE 0.8 09/08/2023 0000   CREATININE 0.74 (L) 06/29/2023 1157   CREATININE 1.18 02/27/2014 1650   CALCIUM  9.8 09/08/2023 0000   CALCIUM  9.3 02/27/2014 1650   PROT 7.1 06/29/2023 1157   PROT 7.5 02/27/2014 1650   ALBUMIN 4.6 09/08/2023 0000   ALBUMIN 5.0 (H) 06/29/2023 1157   ALBUMIN 4.1 02/27/2014 1650   AST 17 09/08/2023 0000   AST 17  02/27/2014 1650   ALT 29 09/08/2023 0000   ALT 28 02/27/2014 1650  ALKPHOS 55 09/08/2023 0000   ALKPHOS 63 02/27/2014 1650   BILITOT 0.4 06/29/2023 1157   BILITOT 0.8 02/27/2014 1650   GFRNONAA >60 11/03/2021 1405   GFRNONAA >60 02/27/2014 1650   GFRNONAA >60 08/15/2011 0854   GFRAA >60 08/06/2015 0404   GFRAA >60 02/27/2014 1650   GFRAA >60 08/15/2011 0854    Lab Results  Component Value Date   WBC 6.8 09/08/2023   HGB 14.7 09/08/2023   HCT 48 09/08/2023   MCV 91 06/29/2023   PLT 212 09/08/2023   Lab Results  Component Value Date   HGBA1C 9.9 09/08/2023   HGBA1C 8.8 (A) 06/29/2023   HGBA1C 8.9 (A) 04/08/2023   Lab Results  Component Value Date   CHOL 138 09/08/2023   HDL 39 09/08/2023   LDLCALC 72 09/08/2023   TRIG 159 09/08/2023   CHOLHDL 4.4 06/29/2023   Lab Results  Component Value Date   TSH 6.580 (H) 06/29/2023      Assessment and Plan:  Type 2 diabetes mellitus with other specified complication, without long-term current use of insulin (HCC) Assessment & Plan: Increase Mounjaro  to 5 mg weekly, seems to be doing well with this medication.  Orders: -     Mounjaro ; Inject 5 mg into the skin once a week.  Dispense: 2 mL; Refill: 0  Long-term current use of injectable noninsulin antidiabetic medication  Encounter for immunization -     Varicella-zoster vaccine IM  Shingrix  dose 2 administered today, completing the series   Return in about 5 weeks (around 12/13/2023) for OV f/u Mounjaro  w/ A1c.    Rolan Hoyle, PA-C, DMSc, Nutritionist [facility] Primary Care and Sports Medicine [facility] [phone]

## 2023-12-03 ENCOUNTER — Ambulatory Visit: Payer: Self-pay | Admitting: Physician Assistant

## 2023-12-03 LAB — DIABETES AUTOIMMUNE PROFILE
Anti GAD 65 Antibodies: <5 U/mL
IA-2 Autoantibodies: <7.5 U/mL
Insulin AutoAb: <5 uU/mL
ZNT8 Antibodies: <15 U/mL

## 2023-12-03 LAB — C-PEPTIDE: C-Peptide: 5.1 ng/mL — ABNORMAL HIGH (ref 1.1–4.4)

## 2023-12-06 ENCOUNTER — Ambulatory Visit: Admitting: Physician Assistant

## 2023-12-06 ENCOUNTER — Other Ambulatory Visit: Payer: Self-pay | Admitting: Physician Assistant

## 2023-12-06 DIAGNOSIS — E1169 Type 2 diabetes mellitus with other specified complication: Secondary | ICD-10-CM

## 2023-12-07 NOTE — Telephone Encounter (Signed)
 Please review.  KP

## 2023-12-07 NOTE — Telephone Encounter (Signed)
 Requested medication (s) are due for refill today: yes  Requested medication (s) are on the active medication list: yes  Last refill:  11/08/23  Future visit scheduled: {Yes  Notes to clinic:   Medication not assigned to a protocol, review manually     Requested Prescriptions  Pending Prescriptions Disp Refills   MOUNJARO  5 MG/0.5ML Pen [Pharmacy Med Name: MOUNJARO  5MG /0.5ML INJ (4 PENS)] 2 mL 0    Sig: ADMINISTER 5 MG UNDER THE SKIN 1 TIME A WEEK     Off-Protocol Failed - 12/07/2023  2:20 PM      Failed - Medication not assigned to a protocol, review manually.      Passed - Valid encounter within last 12 months    Recent Outpatient Visits           4 weeks ago Type 2 diabetes mellitus with other specified complication, without long-term current use of insulin (HCC)   [place] [facility] & [facility] Medicine at [facility], Toribio SQUIBB, [place]   1 month ago Type 2 diabetes mellitus with other specified complication, without long-term current use of insulin (HCC)   [place] Primary Care & Sports Medicine at [facility], Toribio SQUIB[place], [place]   1 month ago Type 2 diabetes mellitus with other specified complication, without long-term current use of insulin [place] [facility])   [place] Primary Care & Sports Medicine at [facility], Toribio SQUIBB, [place]   5 months ago Hearing loss due to cerumen impaction, bilateral   [facility] Care & [facility] Medicine at [facility], Toribio SQUIBB, [place]   5 months ago Annual physical exam   [facility] Primary Care & Sports Medicine at [facility], Toribio SQUIBB, [place]

## 2023-12-13 ENCOUNTER — Encounter: Payer: Self-pay | Admitting: Physician Assistant

## 2023-12-13 ENCOUNTER — Ambulatory Visit: Admitting: Physician Assistant

## 2023-12-13 VITALS — BP 100/70 | HR 77 | Temp 97.8°F | Ht 70.0 in | Wt 174.0 lb

## 2023-12-13 DIAGNOSIS — I959 Hypotension, unspecified: Secondary | ICD-10-CM | POA: Diagnosis not present

## 2023-12-13 DIAGNOSIS — E1169 Type 2 diabetes mellitus with other specified complication: Secondary | ICD-10-CM | POA: Diagnosis not present

## 2023-12-13 DIAGNOSIS — E039 Hypothyroidism, unspecified: Secondary | ICD-10-CM | POA: Diagnosis not present

## 2023-12-13 DIAGNOSIS — Z7985 Long-term (current) use of injectable non-insulin antidiabetic drugs: Secondary | ICD-10-CM

## 2023-12-13 DIAGNOSIS — R7989 Other specified abnormal findings of blood chemistry: Secondary | ICD-10-CM | POA: Diagnosis not present

## 2023-12-13 DIAGNOSIS — G4710 Hypersomnia, unspecified: Secondary | ICD-10-CM

## 2023-12-13 LAB — POCT GLYCOSYLATED HEMOGLOBIN (HGB A1C): Hemoglobin A1C: 7.2 % — AB (ref 4.0–5.6)

## 2023-12-13 NOTE — Progress Notes (Signed)
 Date:  12/13/2023   Name:  Derrick Joyce   DOB:  1970/10/14   MRN:  [id number]   Chief Complaint: Diabetes (No side effects to medication ) and Fatigue (X1-2 months, unchanged, sleeps a lot )  HPI Hy returns for follow-up on DM2 after starting Mounjaro  2 months ago. last A1c 9.9% on 09/08/2023. Testing for LADA/type 1.5 diabetes was completed last month and negative.  He seems to be tolerating Mounjaro  well at the 5 mg dose.  He is joined by his wife Jon who tells me he has been sleeping much more than usual, and does not seem to have much motivation to complete tasks such as mowing the lawn.  Sometimes naps for several hours during the day.  Yesterday he got lightheaded after standing and Jon voices some concern for his blood pressure.   Medication list has been reviewed and updated.  Current Meds  Medication Sig   cariprazine  (VRAYLAR ) 3 MG capsule Take 1 capsule (3 mg total) by mouth daily.   DULoxetine  (CYMBALTA ) 60 MG capsule TAKE 1 CAPSULE(60 MG) BY MOUTH DAILY   IBUPROFEN PO Take by mouth daily.   MOUNJARO  5 MG/0.5ML Pen Inject 5 mg into the skin once a week.   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   History of nephrolithiasis 05/18/2022   Bipolar disorder with depression (HCC) 11/03/2021   Low back pain 01/27/2018   Venous stasis 09/29/2016   Type 2 diabetes mellitus with other specified  complication (HCC) 09/23/2016   Lumbar spondylosis 08/05/2015    Allergies  Allergen Reactions   Gabapentin  Other (See Comments)    Suicidal ideation   Cephalexin Rash    Immunization History  Administered Date(s) Administered   PFIZER(Purple Top)SARS-COV-2 Vaccination 06/01/2019, 06/27/2019   Tdap 06/29/2023   Zoster Recombinant(Shingrix ) 06/29/2023, 11/08/2023    Past Surgical History:  Procedure Laterality Date   APPENDECTOMY     BACK SURGERY     HERNIA REPAIR     TYMPANOPLASTY      Social History   Tobacco Use   Smoking status: Never   Smokeless tobacco: Never  Vaping Use   Vaping status: Never Used  Substance Use Topics   Alcohol use: No   Drug use: Yes    Types: Marijuana    Family History  Problem Relation Age of Onset   Bipolar disorder Mother    Diabetes Father    Diabetes Paternal Grandmother    Cancer Paternal Grandmother    Diabetes Paternal Grandfather    Cancer Paternal Grandfather    Heart attack Paternal Grandfather         12/13/2023    2:05 PM 11/08/2023    2:01 PM 06/29/2023   11:13 AM 06/01/2023    3:07 PM  GAD 7 : Generalized Anxiety Score  Nervous, Anxious, on Edge 1 1 1 2   Control/stop worrying 1 1 1 1   Worry too much - different things 1 1 1  1  Trouble relaxing 1 1 2  0  Restless 2 2 3 1   Easily annoyed or irritable 0 2 3 2   Afraid - awful might happen 0 0 0 0  Total GAD 7 Score 6 8 11 7   Anxiety Difficulty Somewhat difficult Somewhat difficult Not difficult at all Not difficult at all       12/13/2023    2:05 PM 11/08/2023    2:01 PM 06/29/2023   11:13 AM  Depression screen PHQ 2/9  Decreased Interest 0 0 1  Down, Depressed, Hopeless 0 0 1  PHQ - 2 Score 0 0 2  Altered sleeping 3  2  Tired, decreased energy 0  2  Change in appetite 0  0  Feeling bad or failure about yourself  0  1  Trouble concentrating 0  1  Moving slowly or fidgety/restless 1  3  Suicidal thoughts 0  0  PHQ-9 Score 4  11  Difficult doing  work/chores Not difficult at all  Not difficult at all    BP Readings from Last 3 Encounters:  12/13/23 100/70  11/08/23 102/84  10/13/23 104/80    Wt Readings from Last 3 Encounters:  12/13/23 174 lb (78.9 kg)  11/08/23 180 lb (81.6 kg)  10/13/23 188 lb (85.3 kg)     BP 100/70 (Cuff Size: Normal)   Pulse 77   Temp 97.8 F (36.6 C)   Ht 5' 10 (1.778 m)   Wt 174 lb (78.9 kg)   SpO2 96%   BMI 24.97 kg/m   Physical Exam Vitals and nursing note reviewed.  Constitutional:      Appearance: Normal appearance.  Neck:     Vascular: No carotid bruit.  Cardiovascular:     Rate and Rhythm: Normal rate and regular rhythm.     Heart sounds: No murmur heard.    No friction rub. No gallop.     Comments: Orthostasis with standing BP dropped to 82/68 Pulmonary:     Effort: Pulmonary effort is normal.     Breath sounds: Normal breath sounds.  Abdominal:     General: There is no distension.  Musculoskeletal:        General: Normal range of motion.  Skin:    General: Skin is warm and dry.  Neurological:     Mental Status: He is alert and oriented to person, place, and time.     Gait: Gait is intact.  Psychiatric:        Mood and Affect: Mood and affect normal.     Recent Labs     Component Value Date/Time   NA 5 (A) 09/08/2023 0000   NA 137 02/27/2014 1650   K 4.5 06/29/2023 1157   K 3.8 02/27/2014 1650   CL 101 09/08/2023 0000   CL 104 02/27/2014 1650   CO2 20 09/08/2023 0000   CO2 24 02/27/2014 1650   GLUCOSE 158 (H) 06/29/2023 1157   GLUCOSE 160 (H) 11/03/2021 1405   GLUCOSE 110 (H) 02/27/2014 1650   BUN 11 09/08/2023 0000   BUN 16 02/27/2014 1650   CREATININE 0.8 09/08/2023 0000   CREATININE 0.74 (L) 06/29/2023 1157   CREATININE 1.18 02/27/2014 1650   CALCIUM  9.8 09/08/2023 0000   CALCIUM  9.3 02/27/2014 1650   PROT 7.1 06/29/2023 1157   PROT 7.5 02/27/2014 1650   ALBUMIN 4.6 09/08/2023 0000   ALBUMIN 5.0 (H) 06/29/2023 1157   ALBUMIN 4.1 02/27/2014 1650    AST 17 09/08/2023 0000  AST 17 02/27/2014 1650   ALT 29 09/08/2023 0000   ALT 28 02/27/2014 1650   ALKPHOS 55 09/08/2023 0000   ALKPHOS 63 02/27/2014 1650   BILITOT 0.4 06/29/2023 1157   BILITOT 0.8 02/27/2014 1650   GFRNONAA >60 11/03/2021 1405   GFRNONAA >60 02/27/2014 1650   GFRNONAA >60 08/15/2011 0854   GFRAA >60 08/06/2015 0404   GFRAA >60 02/27/2014 1650   GFRAA >60 08/15/2011 0854    Lab Results  Component Value Date   WBC 6.8 09/08/2023   HGB 14.7 09/08/2023   HCT 48 09/08/2023   MCV 91 06/29/2023   PLT 212 09/08/2023   Lab Results  Component Value Date   HGBA1C 7.2 (A) 12/13/2023   HGBA1C 9.9 09/08/2023   HGBA1C 8.8 (A) 06/29/2023   Lab Results  Component Value Date   CHOL 138 09/08/2023   HDL 39 09/08/2023   LDLCALC 72 09/08/2023   TRIG 159 09/08/2023   CHOLHDL 4.4 06/29/2023   Lab Results  Component Value Date   TSH 6.580 (H) 06/29/2023      Assessment and Plan:  1. Type 2 diabetes mellitus with other specified complication, without long-term current use of insulin (HCC) (Primary) Congratulated on A1c 7.2% today which is the best it has been in some time.  Pending labs, will refill Mounjaro  either at the 2.5 or 5 mg dose.  - POCT HgB A1C - Comprehensive metabolic panel with GFR - CBC with Differential/Platelet - TSH + free T4 - Lipid panel  2. Hypotension, unspecified hypotension type Uncertain as to whether or not his hypotension and fatigue are side effect of Mounjaro  or have another cause.  Could be indirect effects of Mounjaro  for example reducing caloric intake, causing blood sugar, or affecting absorption of other medications.  Will begin by checking labs. - Comprehensive metabolic panel with GFR - CBC with Differential/Platelet - TSH + free T4  3. Elevated TSH - TSH + free T4  4. Hypersomnia - Comprehensive metabolic panel with GFR - CBC with Differential/Platelet - TSH + free T4    Return in about 3 months (around  03/13/2024) for OV f/u DM2.   I personally spent a total of 32 minutes in the care of the patient today including preparing to see the patient, performing a medically appropriate exam/evaluation, counseling and educating, placing orders, and documenting clinical information in the EHR.   Rolan Hoyle, PA-C, DMSc, Nutritionist [facility] Primary Care and [facility] [phone]

## 2023-12-14 ENCOUNTER — Ambulatory Visit: Payer: Self-pay | Admitting: Physician Assistant

## 2023-12-14 ENCOUNTER — Other Ambulatory Visit: Payer: Self-pay | Admitting: Physician Assistant

## 2023-12-14 DIAGNOSIS — E039 Hypothyroidism, unspecified: Secondary | ICD-10-CM

## 2023-12-14 DIAGNOSIS — E1169 Type 2 diabetes mellitus with other specified complication: Secondary | ICD-10-CM

## 2023-12-14 LAB — COMPREHENSIVE METABOLIC PANEL WITH GFR
ALT: 52 IU/L — ABNORMAL HIGH (ref 0–44)
AST: 25 IU/L (ref 0–40)
Albumin: 4.3 g/dL (ref 3.8–4.9)
Alkaline Phosphatase: 72 IU/L (ref 47–123)
BUN/Creatinine Ratio: 22 — ABNORMAL HIGH (ref 9–20)
BUN: 17 mg/dL (ref 6–24)
Bilirubin Total: 0.3 mg/dL (ref 0.0–1.2)
CO2: 22 mmol/L (ref 20–29)
Calcium: 9.6 mg/dL (ref 8.7–10.2)
Chloride: 101 mmol/L (ref 96–106)
Creatinine, Ser: 0.76 mg/dL (ref 0.76–1.27)
Globulin, Total: 2.1 g/dL (ref 1.5–4.5)
Glucose: 106 mg/dL — ABNORMAL HIGH (ref 70–99)
Potassium: 4.1 mmol/L (ref 3.5–5.2)
Sodium: 138 mmol/L (ref 134–144)
Total Protein: 6.4 g/dL (ref 6.0–8.5)
eGFR: 109 mL/min/1.73 (ref >59)

## 2023-12-14 LAB — CBC WITH DIFFERENTIAL/PLATELET
Basophils Absolute: 0.1 x10E3/uL (ref 0.0–0.2)
Basos: 1 %
EOS (ABSOLUTE): 0.2 x10E3/uL (ref 0.0–0.4)
Eos: 2 %
Hematocrit: 42.2 % (ref 37.5–51.0)
Hemoglobin: 13.9 g/dL (ref 13.0–17.7)
Immature Grans (Abs): 0 x10E3/uL (ref 0.0–0.1)
Immature Granulocytes: 0 %
Lymphocytes Absolute: 2.5 x10E3/uL (ref 0.7–3.1)
Lymphs: 30 %
MCH: 29.8 pg (ref 26.6–33.0)
MCHC: 32.9 g/dL (ref 31.5–35.7)
MCV: 90 fL (ref 79–97)
Monocytes Absolute: 0.9 x10E3/uL (ref 0.1–0.9)
Monocytes: 11 %
Neutrophils Absolute: 4.5 x10E3/uL (ref 1.4–7.0)
Neutrophils: 56 %
Platelets: 295 x10E3/uL (ref 150–450)
RBC: 4.67 x10E6/uL (ref 4.14–5.80)
RDW: 13.1 % (ref 11.6–15.4)
WBC: 8.1 x10E3/uL (ref 3.4–10.8)

## 2023-12-14 LAB — TSH+FREE T4
Free T4: 0.82 ng/dL (ref 0.82–1.77)
TSH: 9.56 u[IU]/mL — ABNORMAL HIGH (ref 0.450–4.500)

## 2023-12-14 LAB — LIPID PANEL
Chol/HDL Ratio: 3.8 ratio (ref 0.0–5.0)
Cholesterol, Total: 114 mg/dL (ref 100–199)
HDL: 30 mg/dL — ABNORMAL LOW (ref >39)
LDL Chol Calc (NIH): 64 mg/dL (ref 0–99)
Triglycerides: 110 mg/dL (ref 0–149)
VLDL Cholesterol Cal: 20 mg/dL (ref 5–40)

## 2023-12-14 MED ORDER — LEVOTHYROXINE SODIUM 25 MCG PO TABS: 25.0000 ug | ORAL_TABLET | Freq: Every day | ORAL | 0 refills | Status: DC

## 2023-12-14 MED ORDER — MOUNJARO 2.5 MG/0.5ML ~~LOC~~ SOAJ: 2.5000 mg | SUBCUTANEOUS | 1 refills | Status: DC

## 2023-12-22 ENCOUNTER — Encounter: Payer: Self-pay | Admitting: Physician Assistant

## 2023-12-22 DIAGNOSIS — E063 Autoimmune thyroiditis: Secondary | ICD-10-CM | POA: Insufficient documentation

## 2023-12-22 LAB — ANTI-TPO AB (RDL): Anti-TPO Ab (RDL): 269 [IU]/mL — ABNORMAL HIGH (ref <9.0)

## 2023-12-22 LAB — SPECIMEN STATUS REPORT

## 2024-01-05 ENCOUNTER — Other Ambulatory Visit: Payer: Self-pay | Admitting: Physician Assistant

## 2024-01-05 DIAGNOSIS — F319 Bipolar disorder, unspecified: Secondary | ICD-10-CM

## 2024-01-05 MED ORDER — CARIPRAZINE HCL 3 MG PO CAPS: 3.0000 mg | ORAL_CAPSULE | Freq: Every day | ORAL | 0 refills | Status: DC

## 2024-01-06 NOTE — Telephone Encounter (Signed)
 Requested Prescriptions  Pending Prescriptions Disp Refills   DULoxetine  (CYMBALTA ) 60 MG capsule [Pharmacy Med Name: DULOXETINE  DR 60MG  CAPSULES] 90 capsule 1    Sig: TAKE 1 CAPSULE(60 MG) BY MOUTH DAILY     Psychiatry: Antidepressants - SNRI - duloxetine  Passed - 01/06/2024  4:30 PM      Passed - Cr in normal range and within 360 days    Creatinine  Date Value Ref Range Status  02/27/2014 1.18 0.60 - 1.30 mg/dL Final   Creatinine, Ser  Date Value Ref Range Status  12/13/2023 0.76 0.76 - 1.27 mg/dL Final   Creatinine, Urine  Date Value Ref Range Status  11/06/2021 103.2  Final         Passed - eGFR is 30 or above and within 360 days    EGFR (African American)  Date Value Ref Range Status  02/27/2014 >60 >60mL/min Final  08/15/2011 >60  Final   GFR calc Af Amer  Date Value Ref Range Status  08/06/2015 >60 >60 mL/min Final    Comment:    (NOTE) The eGFR has been calculated using the CKD EPI equation. This calculation has not been validated in all clinical situations. eGFR's persistently <60 mL/min signify possible Chronic Kidney Disease.    EGFR (Non-African Amer.)  Date Value Ref Range Status  02/27/2014 >60 >38mL/min Final    Comment:    eGFR values <36mL/min/1.73 m2 may be an indication of chronic kidney disease (CKD). Calculated eGFR, using the MRDR Study equation, is useful in  patients with stable renal function. The eGFR calculation will not be reliable in acutely ill patients when serum creatinine is changing rapidly. It is not useful in patients on dialysis. The eGFR calculation may not be applicable to patients at the low and high extremes of body sizes, pregnant women, and vegetarians.   08/15/2011 >60  Final    Comment:    eGFR values <66mL/min/1.73 m2 may be an indication of chronic kidney disease (CKD). Calculated eGFR is useful in patients with stable renal function. The eGFR calculation will not be reliable in acutely ill patients when serum  creatinine is changing rapidly. It is not useful in  patients on dialysis. The eGFR calculation may not be applicable to patients at the low and high extremes of body sizes, pregnant women, and vegetarians.    GFR, Estimated  Date Value Ref Range Status  11/03/2021 >60 >60 mL/min Final    Comment:    (NOTE) Calculated using the CKD-EPI Creatinine Equation (2021)    eGFR  Date Value Ref Range Status  12/13/2023 109 >59 mL/min/1.73 Final         Passed - Completed PHQ-2 or PHQ-9 in the last 360 days      Passed - Last BP in normal range    BP Readings from Last 1 Encounters:  12/13/23 100/70         Passed - Valid encounter within last 6 months    Recent Outpatient Visits           3 weeks ago Type 2 diabetes mellitus with other specified complication, without long-term current use of insulin (HCC)   [place] [facility] at [facility], [place], [place]   1 month ago Type 2 diabetes mellitus with other specified complication, without long-term current use of insulin [facility])   [place] [facility] Medicine at [facility], Toribio SQU[place]B, [place]   2 months ago Type 2 diabetes mellitus  with other specified complication, without long-term current use of insulin [facility])   [place] [facility] Sports Medicine at [facility], Toribio SQU[place]B, [place]   2 months ago Type 2 diabetes mellitus with other specified complication, without long-term current use of insulin [facility])   [place] [facility] Medicine at [facility], [place], [place]   6 months ago Hearing loss due to cerumen impaction, bilateral   [facility] Primary Care & [facility] Medicine at [facility], Toribio SQUIBB, [place]

## 2024-01-13 ENCOUNTER — Telehealth: Payer: Self-pay

## 2024-01-13 ENCOUNTER — Other Ambulatory Visit (HOSPITAL_COMMUNITY): Payer: Self-pay

## 2024-01-13 ENCOUNTER — Telehealth: Payer: Self-pay | Admitting: Pharmacy Technician

## 2024-01-13 NOTE — Telephone Encounter (Signed)
 Pharmacy Patient Advocate Encounter  Received notification from [facility] that Prior Authorization for Mounjaro  2.5MG /0.5ML auto-injectors has been APPROVED from 01/13/24 to 04/12/24   PA #/Case ID/Reference #: [id number]

## 2024-01-13 NOTE — Telephone Encounter (Signed)
 Pharmacy Patient Advocate Encounter   Received notification from Pt Calls Messages that prior authorization for Mounjaro  2.5MG /0.5ML auto-injectors is required/requested for qty limit override   Insurance verification completed.   The patient is insured through [facility].   Per test claim: PA required; PA submitted to above mentioned insurance via Latent Key/confirmation #/EOC [id number] Status is pending

## 2024-01-13 NOTE — Telephone Encounter (Signed)
 Please complete PA for Mounjaro  2.5MG .  KP

## 2024-01-13 NOTE — Telephone Encounter (Signed)
 PA request has been Submitted. New Encounter has been or will be created for follow up. For additional info see Pharmacy Prior Auth telephone encounter from 01/13/24.

## 2024-01-14 NOTE — Telephone Encounter (Signed)
Called pt left VM with approval.  KP

## 2024-01-14 NOTE — Telephone Encounter (Signed)
 Received notification from [facility] that Prior Authorization for Mounjaro  2.5MG /0.5ML auto-injectors has been APPROVED from 01/13/24 to 04/12/24

## 2024-01-26 ENCOUNTER — Ambulatory Visit: Admitting: Physician Assistant

## 2024-01-28 ENCOUNTER — Other Ambulatory Visit: Payer: Self-pay

## 2024-01-28 ENCOUNTER — Telehealth: Payer: Self-pay

## 2024-01-28 DIAGNOSIS — F319 Bipolar disorder, unspecified: Secondary | ICD-10-CM

## 2024-01-28 NOTE — Telephone Encounter (Signed)
 Called pt left VM to call back. To let me know what pharmacy pt would like to use. Got fax from [facility].  KP

## 2024-02-07 ENCOUNTER — Other Ambulatory Visit: Payer: Self-pay | Admitting: Physician Assistant

## 2024-02-07 DIAGNOSIS — E1169 Type 2 diabetes mellitus with other specified complication: Secondary | ICD-10-CM

## 2024-02-08 NOTE — Telephone Encounter (Signed)
 Please review.  KP

## 2024-02-08 NOTE — Telephone Encounter (Signed)
 Requested medication (s) are due for refill today - yes  Requested medication (s) are on the active medication list -yes  Future visit scheduled -yes  Last refill: 12/14/23 2ml 1RF  Notes to clinic: off protocol- provider review   Requested Prescriptions  Pending Prescriptions Disp Refills   MOUNJARO  2.5 MG/0.5ML Pen [Pharmacy Med Name: MOUNJARO  2.5MG /0.5ML INJ ( 4 PENS)] 2 mL 1    Sig: ADMINISTER 2.5 MG UNDER THE SKIN 1 TIME A WEEK     Off-Protocol Failed - 02/08/2024  4:03 PM      Failed - Medication not assigned to a protocol, review manually.      Passed - Valid encounter within last 12 months    Recent Outpatient Visits           1 month ago Type 2 diabetes mellitus with other specified complication, without long-term current use of insulin  [facility])   [place] [facility] Medicine at [facility], [place], [place]   3 months ago Type 2 diabetes mellitus with other specified complication, without long-term current use of insulin  [facility])   [place] [facility] Sports Medicine at [facility], [place], [place]   3 months ago Type 2 diabetes mellitus with other specified complication, without long-term current use of insulin  [facility])   [place] [facility] Care & Sports Medicine at [facility], [place], [place]   3 months ago Type 2 diabetes mellitus with other specified complication, without long-term current use of insulin  [facility])   [place] [facility] Care [facility] Sports Medicine at [facility], Torib[place] SQU[place], [place]   7 months ago Hearing loss due to cerumen impaction, bilateral   [facility] Medicine at [facility], Toribio SQUIBB, [place]                 Requested Prescriptions  Pending Prescriptions Disp Refills   MOUNJARO  2.5 MG/0.5ML Pen [Pharmacy Med Name: MOUNJARO  2.5MG /0.5ML INJ ( 4 PENS)] 2 mL 1    Sig: ADMINISTER 2.5 MG UNDER THE SKIN 1 TIME A WEEK     Off-Protocol Failed - 02/08/2024  4:03 PM       Failed - Medication not assigned to a protocol, review manually.      Passed - Valid encounter within last 12 months    Recent Outpatient Visits           1 month ago Type 2 diabetes mellitus with other specified complication, without long-term current use of insulin  [facility])   [place] [facility] Care [facility] Sports Medicine at [facility], Torib[place], [place]   3 months ago Type 2 diabetes mellitus with other specified complication, without long-term current use of insulin  [facility])   [facility] Sports Medicine at [facility], [place]ib[place]QU[place], [place]   3 months ago Type 2 diabetes mellitus with other specified complication, without long-term current use of insulin  [facility]    Primary Care & Sports Medicine at [facility], [place], [place]   3 months ago Type 2 diabetes mellitus with other specified complication, without long-term current use of insulin  [facility])   [facility] Care [facility] Medicine at [facility], [place]ib[place]QU[place], [place]   7 months ago Hearing loss due to cerumen impaction, bilateral   [facility] Primary Care & Sports Medicine at [facility], Toribio SQUIBB, [place]

## 2024-02-14 ENCOUNTER — Telehealth: Payer: Self-pay

## 2024-02-14 NOTE — Telephone Encounter (Signed)
 Please complete PA for Mounjaro  2.5MG .  KP

## 2024-02-15 ENCOUNTER — Other Ambulatory Visit (HOSPITAL_COMMUNITY): Payer: Self-pay

## 2024-02-15 ENCOUNTER — Telehealth: Payer: Self-pay | Admitting: Pharmacy Technician

## 2024-02-15 NOTE — Telephone Encounter (Signed)
Per plan

## 2024-02-15 NOTE — Telephone Encounter (Signed)
 Pharmacy Patient Advocate Encounter   Received notification from Pt Calls Messages that prior authorization for MOUNJARO  2.5 MG is due for renewal.   Insurance verification completed.   The patient is insured through [facility].  Action: Medication is now available without a prior authorization.

## 2024-02-27 ENCOUNTER — Other Ambulatory Visit: Payer: Self-pay | Admitting: Physician Assistant

## 2024-02-27 DIAGNOSIS — F319 Bipolar disorder, unspecified: Secondary | ICD-10-CM

## 2024-02-29 NOTE — Telephone Encounter (Signed)
 Requested medication (s) are due for refill today: Yes  Requested medication (s) are on the active medication list: Yes  Last refill:  01/05/24  Future visit scheduled: Yes  Notes to clinic:  Unable to refill per protocol, cannot delegate.      Requested Prescriptions  Pending Prescriptions Disp Refills   VRAYLAR  3 MG capsule [Pharmacy Med Name: VRAYLAR  3MG  CAPSULES] 90 capsule 0    Sig: TAKE 1 CAPSULE(3 MG) BY MOUTH DAILY     Off-Protocol Failed - 02/29/2024  4:52 PM      Failed - Medication not assigned to a protocol, review manually.      Passed - Valid encounter within last 12 months    Recent Outpatient Visits           2 months ago Type 2 diabetes mellitus with other specified complication, without long-term current use of insulin  [facility])   [place] [facility] Medicine at [facility], [place]ib[place], [place]   3 months ago Type 2 diabetes mellitus with other specified complication, without long-term current use of insulin  [facility])   [place] [facility] Care [facility] Sports Medicine at [facility], [place], [place]   4 months ago Type 2 diabetes mellitus with other specified complication, without long-term current use of insulin  [facility])   [place] Primary Care & Sports Medicine at [facility], [place]QU[place], [place]   4 months ago Type 2 diabetes mellitus with other specified complication, without long-term current use of insulin  [facility])   [facility] Medicine at [facility], [place], [place]   8 months ago Hearing loss due to cerumen impaction, bilateral   [facility] Primary Care & Sports Medicine at [facility], Toribio SQUIBB, [place]

## 2024-03-02 NOTE — Telephone Encounter (Signed)
 Please review medication refill request

## 2024-03-04 ENCOUNTER — Other Ambulatory Visit: Payer: Self-pay | Admitting: Physician Assistant

## 2024-03-04 DIAGNOSIS — F319 Bipolar disorder, unspecified: Secondary | ICD-10-CM

## 2024-03-08 NOTE — Telephone Encounter (Signed)
 Requested medication (s) are due for refill today - no  Requested medication (s) are on the active medication list -yes  Future visit scheduled -yes  Last refill: 03/02/24 #90 1RF  Notes to clinic: Duplicate  request- off protocol- provider review   Requested Prescriptions  Pending Prescriptions Disp Refills   VRAYLAR  3 MG capsule [Pharmacy Med Name: VRAYLAR  3MG  CAPSULES] 90 capsule 1    Sig: TAKE 1 CAPSULE(3 MG) BY MOUTH DAILY     Off-Protocol Failed - 03/08/2024  9:11 AM      Failed - Medication not assigned to a protocol, review manually.      Passed - Valid encounter within last 12 months    Recent Outpatient Visits           2 months ago Type 2 diabetes mellitus with other specified complication, without long-term current use of insulin  [facility])   [place] [facility] Sports Medicine at [facility], [place], [place]   4 months ago Type 2 diabetes mellitus with other specified complication, without long-term current use of insulin  [facility])   [place] [facility] Care [facility] Sports Medicine at [facility], [place]QU[place], [place]   4 months ago Type 2 diabetes mellitus with other specified complication, without long-term current use of insulin  [facility])   [place] [facility] Sports Medicine at [facility], [place], [place]   4 months ago Type 2 diabetes mellitus with other specified complication, without long-term current use of insulin  [facility])   [place] [facility] Care [facility] Sports Medicine at [facility] [place], [place]   8 months ago Hearing loss due to cerumen impaction, bilateral   [facility] at [facility], Toribio SQUIBB, [place]                 Requested Prescriptions  Pending Prescriptions Disp Refills   VRAYLAR  3 MG capsule [Pharmacy Med Name: VRAYLAR  3MG  CAPSULES] 90 capsule 1    Sig: TAKE 1 CAPSULE(3 MG) BY MOUTH DAILY     Off-Protocol Failed - 03/08/2024  9:11 AM      Failed - Medication not  assigned to a protocol, review manually.      Passed - Valid encounter within last 12 months    Recent Outpatient Visits           2 months ago Type 2 diabetes mellitus with other specified complication, without long-term current use of insulin  [facility])   [place] [facility] Medicine at [facility], [place]   4 months ago Type 2 diabetes mellitus with other specified complication, without long-term current use of insulin  [facility])   [place] [facility] Sports Medicine at [facility], [place]QU[place], [place]   4 months ago Type 2 diabetes mellitus with other specified complication, without long-term current use of insulin  [facility])   [place] Primary Care & Sports Medicine at [facility], Tor[place] SQU[place], [place]   4 months ago Type 2 diabetes mellitus with other specified complication, without long-term current use of insulin  [facility])   [name] Primary Care & [facility] Medicine at [facility], Torib[place] SQU[place], [place]   8 months ago Hearing loss due to cerumen impaction, bilateral   [facility] Sports Medicine at [facility], Toribio SQUIBB, [place]

## 2024-03-10 ENCOUNTER — Other Ambulatory Visit: Payer: Self-pay | Admitting: Physician Assistant

## 2024-03-10 DIAGNOSIS — E039 Hypothyroidism, unspecified: Secondary | ICD-10-CM

## 2024-03-13 ENCOUNTER — Ambulatory Visit: Admitting: Physician Assistant

## 2024-03-13 NOTE — Telephone Encounter (Signed)
 Requested medications are due for refill today.  yes  Requested medications are on the active medications list.  yes  Last refill. 12/14/2023 #90 0 rf  Future visit scheduled.   yes  Notes to clinic.  Abnormal labs.    Requested Prescriptions  Pending Prescriptions Disp Refills   levothyroxine  (SYNTHROID ) 25 MCG tablet [Pharmacy Med Name: LEVOTHYROXINE  0.025MG  ( ) TAB] 90 tablet 0    Sig: TAKE 1 TABLET(25 MCG) BY MOUTH DAILY     Endocrinology:  Hypothyroid Agents Failed - 03/13/2024  3:36 PM      Failed - TSH in normal range and within 360 days    TSH  Date Value Ref Range Status  12/13/2023 9.560 (H) 0.450 - 4.500 uIU/mL Final         Passed - Valid encounter within last 12 months    Recent Outpatient Visits           3 months ago Type 2 diabetes mellitus with other specified complication, without long-term current use of insulin  [facility])   [place] [facility] Medicine at [facility], Toribio SQU[place]B, [place]   4 months ago Type 2 diabetes mellitus with other specified complication, without long-term current use of insulin  [facility])    Primary Care & Sports Medicine at [facility], Toribio SQUIBB, [place]   4 months ago Type 2 diabetes mellitus with other specified complication, without long-term current use of insulin  [facility])    Primary Care & Sports Medicine at [facility], Toribio SQUIBB, [place]   5 months ago Type 2 diabetes mellitus with other specified complication, without long-term current use of insulin  [facility])   [facility] Primary Care & Sports Medicine at [facility], Toribio SQU[place]B, [place]   8 months ago Hearing loss due to cerumen impaction, bilateral   [facility] Primary Care & Sports Medicine at [facility], Toribio SQUIBB, [place]

## 2024-03-17 ENCOUNTER — Other Ambulatory Visit: Payer: Self-pay | Admitting: Physician Assistant

## 2024-03-17 DIAGNOSIS — E039 Hypothyroidism, unspecified: Secondary | ICD-10-CM

## 2024-03-17 NOTE — Telephone Encounter (Signed)
 Requested Prescriptions  Refused Prescriptions Disp Refills   levothyroxine  (SYNTHROID ) 25 MCG tablet [Pharmacy Med Name: LEVOTHYROXINE  0.025MG  ( ) TAB] 90 tablet 0    Sig: TAKE 1 TABLET(25 MCG) BY MOUTH DAILY     Endocrinology:  Hypothyroid Agents Failed - 03/17/2024  2:37 PM      Failed - TSH in normal range and within 360 days    TSH  Date Value Ref Range Status  12/13/2023 9.560 (H) 0.450 - 4.500 uIU/mL Final         Passed - Valid encounter within last 12 months    Recent Outpatient Visits           3 months ago Type 2 diabetes mellitus with other specified complication, without long-term current use of insulin  (HCC)   [place] [facility] Care & Sports Medicine at [facility], Toribio SQUIBB, [place]   4 months ago Type 2 diabetes mellitus with other specified complication, without long-term current use of insulin  [facility])   [place] Primary Care & Sports Medicine at [facility], Toribio SQUIBB, [place]   4 months ago Type 2 diabetes mellitus with other specified complication, without long-term current use of insulin  [facility])   [place] [facility] Care & Sports Medicine at [facility], Toribio SQUIBB, [place]   5 months ago Type 2 diabetes mellitus with other specified complication, without long-term current use of insulin  [facility])   [facility] Primary Care & Sports Medicine at [facility], Toribio SQU[place]B, [place]   8 months ago Hearing loss due to cerumen impaction, bilateral   [facility] Primary Care & Sports Medicine at [facility], Toribio SQUIBB, [place]

## 2024-03-28 ENCOUNTER — Other Ambulatory Visit: Payer: Self-pay | Admitting: Physician Assistant

## 2024-03-29 NOTE — Telephone Encounter (Signed)
 Requested Prescriptions  Pending Prescriptions Disp Refills   rosuvastatin  (CRESTOR ) 40 MG tablet [Pharmacy Med Name: ROSUVASTATIN  40MG  TABLETS] 90 tablet 0    Sig: TAKE 1 TABLET(40 MG) BY MOUTH DAILY     Cardiovascular:  Antilipid - Statins 2 Failed - 03/29/2024  8:17 AM      Failed - Lipid Panel in normal range within the last 12 months    Cholesterol, Total  Date Value Ref Range Status  12/13/2023 114 100 - 199 mg/dL Final   LDL Chol Calc (NIH)  Date Value Ref Range Status  12/13/2023 64 0 - 99 mg/dL Final   HDL  Date Value Ref Range Status  12/13/2023 30 (L) >39 mg/dL Final   Triglycerides  Date Value Ref Range Status  12/13/2023 110 0 - 149 mg/dL Final         Passed - Cr in normal range and within 360 days    Creatinine  Date Value Ref Range Status  02/27/2014 1.18 0.60 - 1.30 mg/dL Final   Creatinine, Ser  Date Value Ref Range Status  12/13/2023 0.76 0.76 - 1.27 mg/dL Final   Creatinine, Urine  Date Value Ref Range Status  11/06/2021 103.2  Final         Passed - Patient is not pregnant      Passed - Valid encounter within last 12 months    Recent Outpatient Visits           3 months ago Type 2 diabetes mellitus with other specified complication, without long-term current use of insulin  (HCC)   [place] [facility] Medicine at [facility], Toribio [place]QU[place], [place]   4 months ago Type 2 diabetes mellitus with other specified complication, without long-term current use of insulin  [facility])   [place] Primary Care & Sports Medicine at [facility], Toribio [place]QU[place], [place]   5 months ago Type 2 diabetes mellitus with other specified complication, without long-term current use of insulin  [facility])   [place] Primary Care & Sports Medicine at [facility], Toribio SQU[place], [place]   5 months ago Type 2 diabetes mellitus with other specified complication, without long-term current use of insulin  [facility])   [facility] Primary Care & Sports Medicine at  [facility], Toribio SQU[place], [place]   9 months ago Hearing loss due to cerumen impaction, bilateral   [facility] Primary Care & Sports Medicine at [facility], Toribio SQUIBB, [place]

## 2024-04-05 ENCOUNTER — Ambulatory Visit: Admitting: Physician Assistant

## 2024-04-05 ENCOUNTER — Encounter: Payer: Self-pay | Admitting: Physician Assistant

## 2024-04-05 VITALS — BP 102/82 | HR 98 | Temp 97.9°F | Ht 70.0 in | Wt 182.0 lb

## 2024-04-05 DIAGNOSIS — E1169 Type 2 diabetes mellitus with other specified complication: Secondary | ICD-10-CM | POA: Diagnosis not present

## 2024-04-05 DIAGNOSIS — Z7985 Long-term (current) use of injectable non-insulin antidiabetic drugs: Secondary | ICD-10-CM

## 2024-04-05 DIAGNOSIS — E063 Autoimmune thyroiditis: Secondary | ICD-10-CM | POA: Diagnosis not present

## 2024-04-05 DIAGNOSIS — R7401 Elevation of levels of liver transaminase levels: Secondary | ICD-10-CM

## 2024-04-05 LAB — POCT GLYCOSYLATED HEMOGLOBIN (HGB A1C): Hemoglobin A1C: 7.4 % — AB (ref 4.0–5.6)

## 2024-04-05 MED ORDER — MOUNJARO 2.5 MG/0.5ML ~~LOC~~ SOAJ: 2.5000 mg | SUBCUTANEOUS | 0 refills | Status: DC

## 2024-04-05 NOTE — Assessment & Plan Note (Addendum)
 Continue with Mounjaro , no dosage adjustments made today

## 2024-04-05 NOTE — Assessment & Plan Note (Addendum)
 Repeat thyroid labs today, also sending for baseline thyroid ultrasound for him to complete at his convenience Orders:   TSH + free T4   US  THYROID

## 2024-04-05 NOTE — Progress Notes (Signed)
 "   Date:  04/05/2024   Name:  Derrick Joyce   DOB:  June 29, 1970   MRN:  [id number]   Chief Complaint: Medical Management of Chronic Issues  HPI  [name] returns today overdue for follow-up on chronic conditions including DM2, Hashimoto's, multiple psychiatric conditions, last seen by me 12/13/2023.  Due for A1c today, most recently 7.2% in September, prior to that 9.9%.  Presently managed with Mounjaro  2.5 mg weekly which is well-tolerated.   Levothyroxine  25 mcg daily was started upon discovering lab confirmed Hashimoto's in September with symptoms consistent with hypothyroidism.  Behavioral health conditions still managed by psychiatrist Dr. Chipper.    Medication list has been reviewed and updated.  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   History of nephrolithiasis 05/18/2022   Bipolar disorder with depression (HCC) 11/03/2021   Low back pain 01/27/2018   Venous stasis 09/29/2016   Type 2 diabetes mellitus with other specified complication (HCC) 09/23/2016   Lumbar spondylosis 08/05/2015    Allergies[2]  Immunization History  Administered Date(s) Administered   PFIZER(Purple Top)SARS-COV-2 Vaccination 06/01/2019, 06/27/2019   Tdap 06/29/2023   Zoster Recombinant(Shingrix ) 06/29/2023, 11/08/2023    Past Surgical History:  Procedure Laterality Date   APPENDECTOMY     BACK SURGERY     HERNIA REPAIR     TYMPANOPLASTY       Social History[3]  Family History  Problem Relation Age of Onset   Bipolar disorder Mother    Diabetes Father    Diabetes Paternal Grandmother    Cancer Paternal Grandmother    Diabetes Paternal Grandfather    Cancer Paternal Grandfather    Heart attack Paternal Grandfather         04/05/2024    1:28 PM 12/13/2023    2:05 PM 11/08/2023    2:01 PM 06/29/2023   11:13 AM  GAD 7 : Generalized Anxiety Score  Nervous, Anxious, on Edge 0 1  1  1    Control/stop worrying 0 1  1  1    Worry too much - different things 0 1  1  1    Trouble relaxing 0 1  1  2    Restless 0 2  2  3    Easily annoyed or irritable 0 0  2  3   Afraid - awful might happen 0 0  0  0   Total GAD 7 Score 0 6 8 11   Anxiety Difficulty Not difficult at all Somewhat difficult Somewhat difficult Not difficult at all     Data saved with a previous flowsheet row definition       04/05/2024    1:28 PM 12/13/2023    2:05 PM 11/08/2023    2:01 PM  Depression screen PHQ 2/9  Decreased Interest 0 0 0  Down, Depressed, Hopeless 0 0 0  PHQ - 2 Score 0 0 0  Altered sleeping  3   Tired, decreased energy  0   Change in appetite  0   Feeling bad or failure  about yourself   0   Trouble concentrating  0   Moving slowly or fidgety/restless  1   Suicidal thoughts  0   PHQ-9 Score  4    Difficult doing work/chores  Not difficult at all      Data saved with a previous flowsheet row definition    BP Readings from Last 3 Encounters:  04/05/24 102/82  12/13/23 100/70  11/08/23 102/84    Wt Readings from Last 3 Encounters:  04/05/24 182 lb (82.6 kg)  12/13/23 174 lb (78.9 kg)  11/08/23 180 lb (81.6 kg)    BP 102/82   Pulse 98   Temp 97.9 F (36.6 C)   Ht 5' 10 (1.778 m)   Wt 182 lb (82.6 kg)   SpO2 95%   BMI 26.11 kg/m   Physical Exam Vitals and nursing note reviewed.  Constitutional:      Appearance: Normal appearance.  Neck:     Thyroid: No thyroid mass or thyromegaly.  Cardiovascular:     Rate and  Rhythm: Normal rate and regular rhythm.     Heart sounds: No murmur heard.    No friction rub. No gallop.  Pulmonary:     Effort: Pulmonary effort is normal.     Breath sounds: Normal breath sounds.  Abdominal:     General: There is no distension.  Musculoskeletal:        General: Normal range of motion.  Skin:    General: Skin is warm and dry.  Neurological:     Mental Status: He is alert and oriented to person, place, and time.     Gait: Gait is intact.  Psychiatric:        Mood and Affect: Mood and affect normal.     Recent Labs     Component Value Date/Time   NA 138 12/13/2023 1455   NA 137 02/27/2014 1650   K 4.1 12/13/2023 1455   K 3.8 02/27/2014 1650   CL 101 12/13/2023 1455   CL 104 02/27/2014 1650   CO2 22 12/13/2023 1455   CO2 24 02/27/2014 1650   GLUCOSE 106 (H) 12/13/2023 1455   GLUCOSE 160 (H) 11/03/2021 1405   GLUCOSE 110 (H) 02/27/2014 1650   BUN 17 12/13/2023 1455   BUN 16 02/27/2014 1650   CREATININE 0.76 12/13/2023 1455   CREATININE 1.18 02/27/2014 1650   CALCIUM  9.6 12/13/2023 1455   CALCIUM  9.3 02/27/2014 1650   PROT 6.4 12/13/2023 1455   PROT 7.5 02/27/2014 1650   ALBUMIN 4.3 12/13/2023 1455   ALBUMIN 4.1 02/27/2014 1650   AST 25 12/13/2023 1455   AST 17 02/27/2014 1650   ALT 52 (H) 12/13/2023 1455   ALT 28 02/27/2014 1650   ALKPHOS 72 12/13/2023 1455   ALKPHOS 63 02/27/2014 1650   BILITOT 0.3 12/13/2023 1455   BILITOT 0.8 02/27/2014 1650   GFRNONAA >60 11/03/2021 1405   GFRNONAA >60 02/27/2014 1650   GFRNONAA >60 08/15/2011 0854   GFRAA >60 08/06/2015 0404   GFRAA >60 02/27/2014 1650   GFRAA >60 08/15/2011 0854    Lab Results  Component Value Date   WBC 8.1 12/13/2023   HGB 13.9 12/13/2023   HCT 42.2 12/13/2023   MCV 90 12/13/2023   PLT 295 12/13/2023   Lab Results  Component Value Date   HGBA1C 7.4 (A) 04/05/2024   HGBA1C 7.2 (A) 12/13/2023   HGBA1C 9.9 09/08/2023   Lab Results  Component Value Date   CHOL 114  12/13/2023  HDL 30 (L) 12/13/2023   LDLCALC 64 12/13/2023   TRIG 110 12/13/2023   CHOLHDL 3.8 12/13/2023   Lab Results  Component Value Date   TSH 9.560 (H) 12/13/2023        Assessment & Plan Type 2 diabetes mellitus with other specified complication, without long-term current use of insulin  (HCC) A1c 7.4% today reflecting good control.  Continue Mounjaro  2.5 mg weekly, seems to be doing well with this medication.  Discussed that I would like for him to incorporate strength training into his weekly routine, recommended at least 2 sessions 20 minutes each which will improve body composition and combat insulin  resistance.  He verbalizes understanding. Orders:   POCT glycosylated hemoglobin (Hb A1C)   MOUNJARO  2.5 MG/0.5ML Pen; Inject 2.5 mg into the skin once a week.   Long-term current use of injectable noninsulin antidiabetic medication Continue with Mounjaro , no dosage adjustments made today    Hashimoto's thyroiditis Repeat thyroid labs today, also sending for baseline thyroid ultrasound for him to complete at his convenience Orders:   TSH + free T4   US  THYROID  Elevated ALT measurement Repeat LFTs today Orders:   Hepatic function panel    Follow-up 3 months CPE, A1c, foot exam, microalbumin   Rolan Hoyle, PA-C, DMSc, DipACLM, Nutritionist [place] [facility] Medicine [facility] [phone]      [1]  Current Meds  Medication Sig   DULoxetine  (CYMBALTA ) 60 MG capsule TAKE 1 CAPSULE(60 MG) BY MOUTH DAILY   IBUPROFEN PO Take by mouth daily.   levothyroxine  (SYNTHROID ) 25 MCG tablet TAKE 1 TABLET(25 MCG) BY MOUTH DAILY   rosuvastatin  (CRESTOR ) 40 MG tablet TAKE 1 TABLET(40 MG) BY MOUTH DAILY   VRAYLAR  3 MG capsule TAKE 1 CAPSULE(3 MG) BY MOUTH DAILY   [DISCONTINUED] MOUNJARO  2.5 MG/0.5ML Pen ADMINISTER 2.5 MG UNDER THE SKIN 1 TIME A WEEK  [2]  Allergies Allergen Reactions   Gabapentin  Other (See  Comments)    Suicidal ideation   Cephalexin Rash  [3]  Social History Tobacco Use   Smoking status: Never   Smokeless tobacco: Never  Vaping Use   Vaping status: Never Used  Substance Use Topics   Alcohol use: No   Drug use: Yes    Types: Marijuana   "

## 2024-04-05 NOTE — Assessment & Plan Note (Addendum)
 A1c 7.4% today reflecting good control.  Continue Mounjaro  2.5 mg weekly, seems to be doing well with this medication.  Discussed that I would like for him to incorporate strength training into his weekly routine, recommended at least 2 sessions 20 minutes each which will improve body composition and combat insulin  resistance.  He verbalizes understanding. Orders:   POCT glycosylated hemoglobin (Hb A1C)   MOUNJARO  2.5 MG/0.5ML Pen; Inject 2.5 mg into the skin once a week.

## 2024-04-06 ENCOUNTER — Ambulatory Visit: Payer: Self-pay | Admitting: Physician Assistant

## 2024-04-06 DIAGNOSIS — E039 Hypothyroidism, unspecified: Secondary | ICD-10-CM

## 2024-04-06 LAB — TSH+FREE T4
Free T4: 1.01 ng/dL (ref 0.82–1.77)
TSH: 6.69 u[IU]/mL — ABNORMAL HIGH (ref 0.450–4.500)

## 2024-04-06 LAB — HEPATIC FUNCTION PANEL
ALT: 71 IU/L — ABNORMAL HIGH (ref 0–44)
AST: 44 IU/L — ABNORMAL HIGH (ref 0–40)
Albumin: 4.6 g/dL (ref 3.8–4.9)
Alkaline Phosphatase: 68 IU/L (ref 47–123)
Bilirubin Total: 0.5 mg/dL (ref 0.0–1.2)
Bilirubin, Direct: <0.08 mg/dL (ref 0.00–0.40)
Total Protein: 6.8 g/dL (ref 6.0–8.5)

## 2024-04-06 MED ORDER — LEVOTHYROXINE SODIUM 50 MCG PO TABS: 50.0000 ug | ORAL_TABLET | Freq: Every day | ORAL | 1 refills | Status: AC

## 2024-04-12 ENCOUNTER — Ambulatory Visit

## 2024-04-17 ENCOUNTER — Other Ambulatory Visit (HOSPITAL_COMMUNITY): Payer: Self-pay

## 2024-04-19 ENCOUNTER — Ambulatory Visit

## 2024-04-19 ENCOUNTER — Other Ambulatory Visit: Payer: Self-pay | Admitting: Physician Assistant

## 2024-04-19 DIAGNOSIS — E1169 Type 2 diabetes mellitus with other specified complication: Secondary | ICD-10-CM

## 2024-04-20 NOTE — Telephone Encounter (Signed)
 Please review.  KP

## 2024-04-20 NOTE — Telephone Encounter (Signed)
 Requested medication (s) are due for refill today: yes  Requested medication (s) are on the active medication list: yes  Last refill:  04/05/24  Future visit scheduled: yes  Notes to clinic:  Medication not assigned to a protocol, review manually.      Requested Prescriptions  Pending Prescriptions Disp Refills   DULoxetine  (CYMBALTA ) 60 MG capsule [Pharmacy Med Name: DULOXETINE  DR 60MG  CAPSULES] 90 capsule 1    Sig: TAKE 1 CAPSULE(60 MG) BY MOUTH DAILY     Psychiatry: Antidepressants - SNRI - duloxetine  Passed - 04/20/2024  2:46 PM      Passed - Cr in normal range and within 360 days    Creatinine  Date Value Ref Range Status  02/27/2014 1.18 0.60 - 1.30 mg/dL Final   Creatinine, Ser  Date Value Ref Range Status  12/13/2023 0.76 0.76 - 1.27 mg/dL Final   Creatinine, Urine  Date Value Ref Range Status  11/06/2021 103.2  Final         Passed - eGFR is 30 or above and within 360 days    EGFR (African American)  Date Value Ref Range Status  02/27/2014 >60 >49mL/min Final  08/15/2011 >60  Final   GFR calc Af Amer  Date Value Ref Range Status  08/06/2015 >60 >60 mL/min Final    Comment:    (NOTE) The eGFR has been calculated using the CKD EPI equation. This calculation has not been validated in all clinical situations. eGFR's persistently <60 mL/min signify possible Chronic Kidney Disease.    EGFR (Non-African Amer.)  Date Value Ref Range Status  02/27/2014 >60 >45mL/min Final    Comment:    eGFR values <35mL/min/1.73 m2 may be an indication of chronic kidney disease (CKD). Calculated eGFR, using the MRDR Study equation, is useful in  patients with stable renal function. The eGFR calculation will not be reliable in acutely ill patients when serum creatinine is changing rapidly. It is not useful in patients on dialysis. The eGFR calculation may not be applicable to patients at the low and high extremes of body sizes, pregnant women, and vegetarians.   08/15/2011  >60  Final    Comment:    eGFR values <90mL/min/1.73 m2 may be an indication of chronic kidney disease (CKD). Calculated eGFR is useful in patients with stable renal function. The eGFR calculation will not be reliable in acutely ill patients when serum creatinine is changing rapidly. It is not useful in  patients on dialysis. The eGFR calculation may not be applicable to patients at the low and high extremes of body sizes, pregnant women, and vegetarians.    GFR, Estimated  Date Value Ref Range Status  11/03/2021 >60 >60 mL/min Final    Comment:    (NOTE) Calculated using the CKD-EPI Creatinine Equation (2021)    eGFR  Date Value Ref Range Status  12/13/2023 109 >59 mL/min/1.73 Final         Passed - Completed PHQ-2 or PHQ-9 in the last 360 days      Passed - Last BP in normal range    BP Readings from Last 1 Encounters:  04/05/24 102/82         Passed - Valid encounter within last 6 months    Recent Outpatient Visits           2 weeks ago Type 2 diabetes mellitus with other specified complication, without long-term current use of insulin  [facility])   [facility] Primary Care & [facility] Medicine at [facility],  Toribio SQU[place]B, [place]   4 months ago Type 2 diabetes mellitus with other specified complication, without long-term current use of insulin  [facility])   [place] Primary Care & Sports Medicine at [facility], Toribio SQU[place], [place]   5 months ago Type 2 diabetes mellitus with other specified complication, without long-term current use of insulin  [facility])   [place] Primary Care & Sports Medicine at [facility], Toribio SQU[place], [place]   6 months ago Type 2 diabetes mellitus with other specified complication, without long-term current use of insulin  [facility])   [place] Primary Care & Sports Medicine at [facility], Toribio SQU[place]B, [place]   6 months ago Type 2 diabetes mellitus with other specified complication, without long-term current use of insulin  [facility])   [facility]  Health Primary Care & Sports Medicine at [facility], Daniel P, [place]               MOUNJARO  2.5 MG/0.5ML Pen [Pharmacy Med Name: MOUNJARO  2.5MG /0.5ML INJ ( 4 PENS)] 2 mL     Sig: ADMINISTER 2.5 MG UNDER THE SKIN 1 TIME A WEEK     Off-Protocol Failed - 04/20/2024  2:46 PM      Failed - Medication not assigned to a protocol, review manually.      Passed - Valid encounter within last 12 months    Recent Outpatient Visits           2 weeks ago Type 2 diabetes mellitus with other specified complication, without long-term current use of insulin  [facility])   [place] Primary Care & Sports Medicine at [facility], Toribio SQUIBB, [place]   4 months ago Type 2 diabetes mellitus with other specified complication, without long-term current use of insulin  [facility])   [place] Primary Care & Sports Medicine at [facility], Toribio SQUIBB, [place]   5 months ago Type 2 diabetes mellitus with other specified complication, without long-term current use of insulin  [facility])   [place] Primary Care & Sports Medicine at [facility], Toribio SQUIBB, [place]   6 months ago Type 2 diabetes mellitus with other specified complication, without long-term current use of insulin  [facility])   [place] Primary Care & Sports Medicine at [facility], Toribio SQU[place], [place]   6 months ago Type 2 diabetes mellitus with other specified complication, without long-term current use of insulin  [facility])   [facility] Health Primary Care & Sports Medicine at [facility], Toribio SQUIBB, [place]

## 2024-07-05 ENCOUNTER — Encounter: Admitting: Physician Assistant
